# Patient Record
Sex: Male | Born: 1985 | Race: Black or African American | Hispanic: No | Marital: Single | Smoking: Light tobacco smoker
Health system: Southern US, Community
[De-identification: ages and names within clinical notes are randomized; demographics above are authoritative.]

## PROBLEM LIST (undated history)

## (undated) DIAGNOSIS — I499 Cardiac arrhythmia, unspecified: Secondary | ICD-10-CM

## (undated) DIAGNOSIS — J40 Bronchitis, not specified as acute or chronic: Secondary | ICD-10-CM

## (undated) HISTORY — PX: WISDOM TOOTH EXTRACTION: SHX21

---
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
## 2007-02-04 ENCOUNTER — Emergency Department (HOSPITAL_COMMUNITY): Admission: EM | Admit: 2007-02-04 | Discharge: 2007-02-04 | Payer: Self-pay | Admitting: Emergency Medicine

## 2007-08-11 ENCOUNTER — Emergency Department (HOSPITAL_COMMUNITY): Admission: EM | Admit: 2007-08-11 | Discharge: 2007-08-11 | Payer: Self-pay | Admitting: Emergency Medicine

## 2010-08-02 ENCOUNTER — Emergency Department (HOSPITAL_COMMUNITY)
Admission: EM | Admit: 2010-08-02 | Discharge: 2010-08-02 | Payer: Self-pay | Source: Home / Self Care | Attending: Emergency Medicine | Admitting: Emergency Medicine

## 2011-04-04 LAB — URINALYSIS, ROUTINE W REFLEX MICROSCOPIC
Bilirubin Urine: NEGATIVE
Glucose, UA: NEGATIVE
Hgb urine dipstick: NEGATIVE
Ketones, ur: 15 — AB
Nitrite: NEGATIVE
Protein, ur: NEGATIVE
Specific Gravity, Urine: 1.027
Urobilinogen, UA: 1
pH: 6

## 2011-04-04 LAB — GC/CHLAMYDIA PROBE AMP, GENITAL
Chlamydia, DNA Probe: POSITIVE — AB
GC Probe Amp, Genital: NEGATIVE

## 2011-07-17 ENCOUNTER — Emergency Department (INDEPENDENT_AMBULATORY_CARE_PROVIDER_SITE_OTHER)
Admission: EM | Admit: 2011-07-17 | Discharge: 2011-07-17 | Disposition: A | Discharge status: Home / Self Care | Payer: 59 | Source: Home / Self Care

## 2011-07-17 DIAGNOSIS — R591 Generalized enlarged lymph nodes: Secondary | ICD-10-CM

## 2011-07-17 DIAGNOSIS — K089 Disorder of teeth and supporting structures, unspecified: Secondary | ICD-10-CM

## 2011-07-17 DIAGNOSIS — R599 Enlarged lymph nodes, unspecified: Secondary | ICD-10-CM

## 2011-07-17 DIAGNOSIS — K0889 Other specified disorders of teeth and supporting structures: Secondary | ICD-10-CM

## 2011-07-17 MED ORDER — AMOXICILLIN 500 MG PO CAPS: 500.0000 mg | ORAL_CAPSULE | Freq: Three times a day (TID) | ORAL | Status: AC

## 2011-07-17 MED ORDER — IBUPROFEN 800 MG PO TABS: 800.0000 mg | ORAL_TABLET | Freq: Three times a day (TID) | ORAL | Status: AC

## 2011-07-17 NOTE — ED Provider Notes (Signed)
History     CSN: [id number]  Arrival date & time 07/17/11  1230   None     Chief Complaint  Patient presents with  . Lymphadenopathy    (Consider location/radiation/quality/duration/timing/severity/associated sxs/prior treatment) HPI Comments: Pt states he has had some teeth coming in bilat lower posterior gums for a few weeks. Last 2 days he has noticed a tender lump Rt jaw. No swelling of jaw or face. He denies fever or chlls. He has been taking otc pain relievers for his dental pain with mild relief of discomfort.  He has not scheduled an appt with a dentist.   The history is provided by the patient.    History reviewed. No pertinent past medical history.  History reviewed. No pertinent past surgical history.  History reviewed. No pertinent family history.  History  Substance Use Topics  . Smoking status: Current Everyday Smoker  . Smokeless tobacco: Not on file  . Alcohol Use: Yes     socaialy       Review of Systems  Constitutional: Negative for fever and chills.  HENT: Negative for ear pain, congestion, sore throat, rhinorrhea, neck pain and sinus pressure.   Respiratory: Negative for cough.     Allergies  Review of patient's allergies indicates no known allergies.  Home Medications   Current Outpatient Rx  Name Route Sig Dispense Refill  . AMOXICILLIN 500 MG PO CAPS Oral Take 1 capsule (500 mg total) by mouth 3 (three) times daily. 21 capsule 0  . IBUPROFEN 800 MG PO TABS Oral Take 1 tablet (800 mg total) by mouth 3 (three) times daily. 15 tablet 0    BP 146/78  Pulse 61  Temp(Src) 98.2 F (36.8 C) (Oral)  Resp 16  SpO2 98%  Physical Exam  Nursing note and vitals reviewed. Constitutional: He appears well-developed and well-nourished. No distress.  HENT:  Head: Normocephalic and atraumatic.  Right Ear: Tympanic membrane, external ear and ear canal normal.  Left Ear: Tympanic membrane, external ear and ear canal normal.  Nose: Nose normal.    Mouth/Throat: Uvula is midline, oropharynx is clear and moist and mucous membranes are normal. No oropharyngeal exudate, posterior oropharyngeal edema or posterior oropharyngeal erythema.         No facial swelling.   Neck: Neck supple.  Cardiovascular: Normal rate, regular rhythm and normal heart sounds.   Pulmonary/Chest: Effort normal and breath sounds normal. No respiratory distress.  Lymphadenopathy:       Head (right side): Submandibular (one < 1 cm smooth tender ) adenopathy present. No submental and no tonsillar adenopathy present.       Head (left side): No submental, no submandibular and no tonsillar adenopathy present.    He has no cervical adenopathy.  Neurological: He is alert.  Skin: Skin is warm and dry.  Psychiatric: He has a normal mood and affect.    ED Course  Procedures (including critical care time)  Labs Reviewed - No data to display No results found.   1. Pain, dental   2. Lymphadenopathy       MDM  Dental pain with mild Rt submadibular adenopathy.  Pt advised to f/u with dentist.         Melody Comas, PA 07/21/11 1021

## 2011-07-17 NOTE — ED Notes (Signed)
C/o pain in right lower jaw  X past 2 days where he has a tooth coming in, slight redness noted ; NAD

## 2011-07-23 NOTE — ED Provider Notes (Signed)
Medical screening examination/treatment/procedure(s) were performed by non-physician practitioner and as supervising physician I was immediately available for consultation/collaboration.   Lorma Heater DOUGLAS MD.    Dan Bailey Dan Clester Chlebowski, MD 07/23/11 0820 

## 2012-02-23 ENCOUNTER — Emergency Department (HOSPITAL_COMMUNITY)
Admission: EM | Admit: 2012-02-23 | Discharge: 2012-02-23 | Disposition: A | Discharge status: Home / Self Care | Payer: 59 | Attending: Emergency Medicine | Admitting: Emergency Medicine

## 2012-02-23 ENCOUNTER — Encounter (HOSPITAL_COMMUNITY): Payer: Self-pay | Admitting: Emergency Medicine

## 2012-02-23 DIAGNOSIS — X58XXXA Exposure to other specified factors, initial encounter: Secondary | ICD-10-CM | POA: Insufficient documentation

## 2012-02-23 DIAGNOSIS — K0889 Other specified disorders of teeth and supporting structures: Secondary | ICD-10-CM

## 2012-02-23 DIAGNOSIS — S025XXA Fracture of tooth (traumatic), initial encounter for closed fracture: Secondary | ICD-10-CM | POA: Insufficient documentation

## 2012-02-23 MED ORDER — HYDROCODONE-ACETAMINOPHEN 7.5-500 MG/15ML PO SOLN: 15.0000 mL | Freq: Four times a day (QID) | ORAL | Status: AC | PRN

## 2012-02-23 MED ORDER — PENICILLIN V POTASSIUM 250 MG PO TABS: 250.0000 mg | ORAL_TABLET | Freq: Four times a day (QID) | ORAL | Status: AC

## 2012-02-23 NOTE — ED Notes (Signed)
Pt c/o R jaw pain, pt stating he have broken teeth, onset yesterday, has not seen dentist

## 2012-02-25 NOTE — ED Provider Notes (Signed)
History     CSN: [id number]  Arrival date & time 02/23/12  2044   First MD Initiated Contact with Patient 02/23/12 2129      Chief Complaint  Patient presents with  . Dental Pain    (Consider location/radiation/quality/duration/timing/severity/associated sxs/prior treatment) HPI Comments: Pt thinks he may have a broken tooth on the R lower side. Does not have a local dentist. Pain x 2 days.  Patient is a 26 y.o. male presenting with tooth pain. The history is provided by the patient.  Dental PainThe primary symptoms include mouth pain. Primary symptoms do not include dental injury, oral bleeding, oral lesions, headaches, fever, shortness of breath, sore throat, angioedema or cough. The symptoms began yesterday. The symptoms are worsening. The symptoms are new. The symptoms occur constantly.  Affected locations include: teeth.  Additional symptoms include: dental sensitivity to temperature, jaw pain and facial swelling. Additional symptoms do not include: gum swelling, gum tenderness, purulent gums, trismus, trouble swallowing, pain with swallowing, excessive salivation, dry mouth, taste disturbance, smell disturbance and drooling.    History reviewed. No pertinent past medical history.  History reviewed. No pertinent past surgical history.  No family history on file.  History  Substance Use Topics  . Smoking status: Former Games developer  . Smokeless tobacco: Not on file  . Alcohol Use: Yes     socaialy       Review of Systems  Constitutional: Negative for fever.  HENT: Positive for facial swelling. Negative for sore throat, drooling and trouble swallowing.   Respiratory: Negative for cough and shortness of breath.   Neurological: Negative for headaches.    Allergies  Review of patient's allergies indicates no known allergies.  Home Medications   Current Outpatient Rx  Name Route Sig Dispense Refill  . ACETAMINOPHEN 500 MG PO TABS Oral Take 1,000 mg by mouth every 6 (six)  hours as needed. Tooth ache    . HYDROCODONE-ACETAMINOPHEN 7.5-500 MG/15ML PO SOLN Oral Take 15 mLs by mouth every 6 (six) hours as needed for pain. 120 mL 0  . PENICILLIN V POTASSIUM 250 MG PO TABS Oral Take 1 tablet (250 mg total) by mouth 4 (four) times daily. 40 tablet 0    BP 149/85  Pulse 50  Temp 99.2 F (37.3 C) (Oral)  Resp 16  Ht 5\' 11"  (1.803 m)  Wt 175 lb (79.379 kg)  BMI 24.41 kg/m2  SpO2 100%  Physical Exam  Nursing note and vitals reviewed. Constitutional: He appears well-developed and well-nourished. No distress.  HENT:  Head: Normocephalic and atraumatic.       Very slight, subtle swelling to R jawline No trismus Broken 3rd molar on R bottom, tender to same No purulence or swelling of gums  Eyes:       Normal appearance  Neck: Normal range of motion. Neck supple.  Cardiovascular: Normal rate.   Pulmonary/Chest: Effort normal.  Musculoskeletal: Normal range of motion.  Lymphadenopathy:    He has no cervical adenopathy.  Neurological: He is alert.  Skin: Skin is warm and dry. He is not diaphoretic.  Psychiatric: He has a normal mood and affect.    ED Course  Procedures (including critical care time)  Labs Reviewed - No data to display No results found.   1. Pain, dental       MDM  Pt presents with dental pain. Has broken 3rd molar, tender to same. No obvious evidence of dental abscess on exam. No trismus. Pt will be txed with pcn and  lortab elixir. Instructed on importance of f/u with dentist. Return precautions discussed.  Grant Fontana, PA-C 02/25/12 1539

## 2012-02-26 NOTE — ED Provider Notes (Signed)
Medical screening examination/treatment/procedure(s) were performed by non-physician practitioner and as supervising physician I was immediately available for consultation/collaboration. Channon Brougher, MD, FACEP   Nivia Gervase L Keyandre Pileggi, MD 02/26/12 1509 

## 2012-04-03 ENCOUNTER — Encounter (HOSPITAL_COMMUNITY): Payer: Self-pay | Admitting: Emergency Medicine

## 2012-04-03 ENCOUNTER — Emergency Department (INDEPENDENT_AMBULATORY_CARE_PROVIDER_SITE_OTHER)
Admission: EM | Admit: 2012-04-03 | Discharge: 2012-04-03 | Disposition: A | Discharge status: Home / Self Care | Payer: 59 | Source: Home / Self Care

## 2012-04-03 DIAGNOSIS — Z202 Contact with and (suspected) exposure to infections with a predominantly sexual mode of transmission: Secondary | ICD-10-CM

## 2012-04-03 DIAGNOSIS — Z2089 Contact with and (suspected) exposure to other communicable diseases: Secondary | ICD-10-CM

## 2012-04-03 MED ORDER — CEFTRIAXONE SODIUM 250 MG IJ SOLR
INTRAMUSCULAR | Status: AC
  Filled 2012-04-03: qty 250

## 2012-04-03 MED ORDER — AZITHROMYCIN 1 G PO PACK
1.0000 g | PACK | Freq: Once | ORAL | Status: AC
  Administered 2012-04-03: 1 g via ORAL

## 2012-04-03 MED ORDER — AZITHROMYCIN 250 MG PO TABS
ORAL_TABLET | ORAL | Status: AC
  Filled 2012-04-03: qty 1

## 2012-04-03 MED ORDER — LIDOCAINE HCL (PF) 1 % IJ SOLN
INTRAMUSCULAR | Status: AC
  Filled 2012-04-03: qty 5

## 2012-04-03 MED ORDER — AZITHROMYCIN 250 MG PO TABS
ORAL_TABLET | ORAL | Status: AC
  Filled 2012-04-03: qty 4

## 2012-04-03 MED ORDER — CEFTRIAXONE SODIUM 250 MG IJ SOLR
250.0000 mg | Freq: Once | INTRAMUSCULAR | Status: AC
  Administered 2012-04-03: 250 mg via INTRAMUSCULAR

## 2012-04-03 NOTE — ED Notes (Signed)
Pt tolerated well meds... No signs of allergic reaction etc.

## 2012-04-03 NOTE — ED Notes (Signed)
Pt is here to get checked for poss STD exposure.... Pt is asymptomatic... Denies: discharge, urinary problems, fever, nausea, vomiting, diarrhea... States his partner called him and told him that she is being treated for gonorrhea.

## 2012-04-03 NOTE — ED Provider Notes (Addendum)
History     CSN: [id number]  Arrival date & time 04/03/12  1333   None     Chief Complaint  Patient presents with  . Exposure to STD    (Consider location/radiation/quality/duration/timing/severity/associated sxs/prior treatment) HPI Comments: The patient states that he was told by sexual partner that she had gonorrhea and/or Chlamydia recently associated with PID. He denies having any genitourinary symptoms at all and does not feel ill. he has no fever, chills or urinary symptoms.  Patient is a 26 y.o. male presenting with STD exposure.  Exposure to STD    History reviewed. No pertinent past medical history.  Past Surgical History  Procedure Date  . Wisdom tooth extraction     No family history on file.  History  Substance Use Topics  . Smoking status: Current Every Day Smoker -- 0.5 packs/day    Types: Cigarettes  . Smokeless tobacco: Not on file  . Alcohol Use: Yes     socaialy       Review of Systems  Constitutional: Negative.   HENT: Negative.   Respiratory: Negative.   Cardiovascular: Negative.   Genitourinary: Negative.   Neurological: Negative.     Allergies  Review of patient's allergies indicates no known allergies.  Home Medications   Current Outpatient Rx  Name Route Sig Dispense Refill  . ACETAMINOPHEN 500 MG PO TABS Oral Take 1,000 mg by mouth every 6 (six) hours as needed. Tooth ache      BP 148/80  Pulse 54  Temp 98.2 F (36.8 C) (Oral)  Resp 16  SpO2 100%  Physical Exam  Constitutional: He is oriented to person, place, and time. He appears well-developed and well-nourished. No distress.  Neck: Normal range of motion. Neck supple.  Genitourinary: Penis normal. No penile tenderness.       Normal phallus, no discharge. No observed skin lesions, no regional adenopathy  Musculoskeletal: Normal range of motion. He exhibits no edema and no tenderness.  Neurological: He is alert and oriented to person, place, and time.  Skin: Skin is  warm and dry.    ED Course  Procedures (including critical care time)  Labs Reviewed - No data to display No results found.   1. Exposure to STD       MDM  Rocephin 250 mg IM now Azithromycin 1 g by mouth now GC chlamydia DNA obtained, await results.       Hayden Rasmussen, NP 04/03/12 1509  Hayden Rasmussen, NP 08/03/12 1918

## 2012-04-03 NOTE — ED Provider Notes (Signed)
Medical screening examination/treatment/procedure(s) were performed by non-physician practitioner and as supervising physician I was immediately available for consultation/collaboration.  Keimya Briddell   Seward Coran, MD 04/03/12 1519 

## 2012-04-04 LAB — GC/CHLAMYDIA PROBE AMP, GENITAL: Chlamydia, DNA Probe: NEGATIVE

## 2012-04-08 NOTE — ED Notes (Signed)
Dan Bailey, Clinical research associate of this [employer] , spoke with this patient to advise of negative findings on his STD screening report, after confirming ID

## 2012-07-27 ENCOUNTER — Emergency Department (HOSPITAL_COMMUNITY): Payer: 59

## 2012-07-27 ENCOUNTER — Encounter (HOSPITAL_COMMUNITY): Payer: Self-pay | Admitting: *Deleted

## 2012-07-27 ENCOUNTER — Emergency Department (HOSPITAL_COMMUNITY)
Admission: EM | Admit: 2012-07-27 | Discharge: 2012-07-27 | Disposition: A | Discharge status: Home / Self Care | Payer: 59 | Attending: Emergency Medicine | Admitting: Emergency Medicine

## 2012-07-27 DIAGNOSIS — F17200 Nicotine dependence, unspecified, uncomplicated: Secondary | ICD-10-CM | POA: Insufficient documentation

## 2012-07-27 DIAGNOSIS — Y9389 Activity, other specified: Secondary | ICD-10-CM | POA: Insufficient documentation

## 2012-07-27 DIAGNOSIS — S9780XA Crushing injury of unspecified foot, initial encounter: Secondary | ICD-10-CM | POA: Insufficient documentation

## 2012-07-27 DIAGNOSIS — Y9269 Other specified industrial and construction area as the place of occurrence of the external cause: Secondary | ICD-10-CM | POA: Insufficient documentation

## 2012-07-27 DIAGNOSIS — S97122A Crushing injury of left lesser toe(s), initial encounter: Secondary | ICD-10-CM

## 2012-07-27 DIAGNOSIS — W208XXA Other cause of strike by thrown, projected or falling object, initial encounter: Secondary | ICD-10-CM | POA: Insufficient documentation

## 2012-07-27 MED ORDER — IBUPROFEN 600 MG PO TABS: 600.0000 mg | ORAL_TABLET | Freq: Four times a day (QID) | ORAL | Status: DC | PRN

## 2012-07-27 NOTE — ED Notes (Signed)
Unable to obtain a oral temp. Tried 3 different times.

## 2012-07-27 NOTE — ED Provider Notes (Signed)
History     CSN: [id number]  Arrival date & time 07/27/12  4098   First MD Initiated Contact with Patient 07/27/12 0720      Chief Complaint  Patient presents with  . Foot Pain    (Consider location/radiation/quality/duration/timing/severity/associated sxs/prior treatment) HPI Dan Bailey is a 27 y.o. male who presents with complaint of a toe pain. States was at work, and dropped a heavy box onto his toe. Pain to the left 5th toe, swelling, bruising. Pt states he elevated it, iced it. Has not taken anything for pain. States pain with walking and movement of the toe. No other complaints.    History reviewed. No pertinent past medical history.  Past Surgical History  Procedure Date  . Wisdom tooth extraction     History reviewed. No pertinent family history.  History  Substance Use Topics  . Smoking status: Current Every Day Smoker -- 0.5 packs/day    Types: Cigarettes  . Smokeless tobacco: Not on file  . Alcohol Use: Yes     Comment: socaialy       Review of Systems  Constitutional: Negative for fever.  Musculoskeletal: Positive for joint swelling and arthralgias.  Skin: Positive for color change.  Neurological: Negative for weakness and numbness.    Allergies  Review of patient's allergies indicates no known allergies.  Home Medications   Current Outpatient Rx  Name  Route  Sig  Dispense  Refill  . ACETAMINOPHEN 500 MG PO TABS   Oral   Take 1,000 mg by mouth every 6 (six) hours as needed. Tooth ache           BP 124/86  Pulse 53  Resp 16  SpO2 98%  Physical Exam  Nursing note and vitals reviewed. Constitutional: He appears well-developed and well-nourished. No distress.  Cardiovascular: Normal rate, regular rhythm and normal heart sounds.   Pulmonary/Chest: Effort normal and breath sounds normal. No respiratory distress. He has no wheezes. He has no rales.  Musculoskeletal:       5th left toe is swollen, bruised. Tender to palpation from  the MTP joint down entire phalanx. Pain with ROM of the 5th toe at MTP joint. Rest of the foot and toes normal.   Neurological: He is alert.  Skin: Skin is warm and dry.    ED Course  Procedures (including critical care time)  Labs Reviewed - No data to display Dg Foot Complete Left  07/27/2012  *RADIOLOGY REPORT*  Clinical Data: Fifth metatarsal and fifth toe pain with bruising.  LEFT FOOT - COMPLETE 3+ VIEW  Comparison: None.  Findings: No acute osseous or joint abnormality.  IMPRESSION: No acute osseous or joint abnormality.   Original Report Authenticated By: Leanna Battles, M.D.      1. Crushing injury of fifth toe of left foot       MDM  Pt with contusion to the left 5th toe. X-ray negative. Suspect bruising/crush injury. Will treat with elevation, ice, NSAIDs. No further treatment necessary.           Lottie Mussel, [place] 07/27/12 [phone]

## 2012-07-27 NOTE — ED Notes (Signed)
Dropped 60 lb object on left 5th toe. Toe is purple- colored and painful. Worse with ambulation.

## 2012-07-27 NOTE — ED Notes (Addendum)
C/o L foot pain, anterior near forefoot/toes, heavy wood fell on foot, was wearing work boots (not steel toed), occurred Sunday afternoon 1500. Denies sx other than pain. Ambulating with limp.

## 2012-07-28 NOTE — ED Provider Notes (Signed)
Medical screening examination/treatment/procedure(s) were performed by non-physician practitioner and as supervising physician I was immediately available for consultation/collaboration.   Charles B. Sheldon, MD 07/28/12 0710 

## 2012-08-03 NOTE — ED Provider Notes (Signed)
Medical screening examination/treatment/procedure(s) were performed by resident physician or non-physician practitioner and as supervising physician I was immediately available for consultation/collaboration.   Barkley Bruns MD.    Linna Hoff, MD 08/03/12 1924

## 2013-03-12 ENCOUNTER — Emergency Department (HOSPITAL_COMMUNITY)
Admission: EM | Admit: 2013-03-12 | Discharge: 2013-03-12 | Disposition: A | Discharge status: Home / Self Care | Payer: 59 | Attending: Emergency Medicine | Admitting: Emergency Medicine

## 2013-03-12 ENCOUNTER — Encounter (HOSPITAL_COMMUNITY): Payer: Self-pay

## 2013-03-12 ENCOUNTER — Emergency Department (HOSPITAL_COMMUNITY): Payer: 59

## 2013-03-12 DIAGNOSIS — F17200 Nicotine dependence, unspecified, uncomplicated: Secondary | ICD-10-CM | POA: Insufficient documentation

## 2013-03-12 DIAGNOSIS — M549 Dorsalgia, unspecified: Secondary | ICD-10-CM | POA: Insufficient documentation

## 2013-03-12 DIAGNOSIS — R0789 Other chest pain: Secondary | ICD-10-CM | POA: Insufficient documentation

## 2013-03-12 MED ORDER — KETOROLAC TROMETHAMINE 60 MG/2ML IM SOLN
60.0000 mg | Freq: Once | INTRAMUSCULAR | Status: AC
  Administered 2013-03-12: 60 mg via INTRAMUSCULAR
  Filled 2013-03-12: qty 2

## 2013-03-12 MED ORDER — IBUPROFEN 600 MG PO TABS: 600.0000 mg | ORAL_TABLET | Freq: Four times a day (QID) | ORAL | Status: AC | PRN

## 2013-03-12 NOTE — ED Notes (Signed)
Pt taken to room, pt undressed, in gown, on monitor and EKG performed

## 2013-03-12 NOTE — ED Notes (Addendum)
Pt states he started having left back pain yesterday morning which moved around to his left chest. The pain is constant and hurts more when he breathes and with movement. Also reports a dry cough for the past few days. 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 States that he had trouble lifting a 20 lb box today.

## 2013-03-12 NOTE — ED Provider Notes (Signed)
CSN: [id number]     Arrival date & time 03/12/13  4098 History   First MD Initiated Contact with Patient 03/12/13 [phone]     Chief Complaint  Patient presents with  . Chest Pain   (Consider location/radiation/quality/duration/timing/severity/associated sxs/prior Treatment) Patient is a 27 y.o. male presenting with chest pain.  Chest Pain Pain location:  L chest Pain quality: aching   Pain radiates to:  Does not radiate Pain radiates to the back: yes   Pain severity:  Moderate Onset quality:  Gradual Duration:  1 day Timing:  Constant Progression:  Unchanged Chronicity:  New Context: at rest   Relieved by:  Rest Worsened by:  Coughing, deep breathing and movement Ineffective treatments:  None tried Associated symptoms: back pain   Associated symptoms: no abdominal pain, no anxiety, no cough, no diaphoresis, no dizziness, no dysphagia, no fatigue, no fever, no headache, no nausea, no numbness, no shortness of breath, not vomiting and no weakness   Risk factors: male sex and smoking   Risk factors: no coronary artery disease, no diabetes mellitus, no high cholesterol, no hypertension, not obese and no prior DVT/PE     History reviewed. No pertinent past medical history. Past Surgical History  Procedure Laterality Date  . Wisdom tooth extraction     History reviewed. No pertinent family history. History  Substance Use Topics  . Smoking status: Current Every Day Smoker -- 0.25 packs/day    Types: Cigarettes  . Smokeless tobacco: Not on file  . Alcohol Use: Yes     Comment: socaially     Review of Systems  Constitutional: Negative for fever, diaphoresis, activity change, appetite change and fatigue.  HENT: Negative for congestion, facial swelling, rhinorrhea and trouble swallowing.   Eyes: Negative for photophobia and pain.  Respiratory: Negative for cough, chest tightness and shortness of breath.   Cardiovascular: Positive for chest pain. Negative for leg swelling.   Gastrointestinal: Negative for nausea, vomiting, abdominal pain, diarrhea and constipation.  Endocrine: Negative for polydipsia and polyuria.  Genitourinary: Negative for dysuria, urgency, decreased urine volume and difficulty urinating.  Musculoskeletal: Positive for back pain. Negative for gait problem.  Skin: Negative for color change, rash and wound.  Allergic/Immunologic: Negative for immunocompromised state.  Neurological: Negative for dizziness, facial asymmetry, speech difficulty, weakness, numbness and headaches.  Psychiatric/Behavioral: Negative for confusion, decreased concentration and agitation.    Allergies  Review of patient's allergies indicates no known allergies.  Home Medications   Current Outpatient Rx  Name  Route  Sig  Dispense  Refill  . ibuprofen (ADVIL,MOTRIN) 600 MG tablet   Oral   Take 1 tablet (600 mg total) by mouth every 6 (six) hours as needed for pain.   30 tablet   0    BP 137/72  Pulse 65  Temp(Src) 97.9 F (36.6 C) (Oral)  Resp 19  SpO2 98% Physical Exam  Constitutional: He is oriented to person, place, and time. He appears well-developed and well-nourished. No distress.  HENT:  Head: Normocephalic and atraumatic.  Mouth/Throat: No oropharyngeal exudate.  Eyes: Pupils are equal, round, and reactive to light.  Neck: Normal range of motion. Neck supple.  Cardiovascular: Normal rate, regular rhythm and normal heart sounds.  Exam reveals no gallop and no friction rub.   No murmur heard. Pulmonary/Chest: Effort normal and breath sounds normal. No respiratory distress. He has no wheezes. He has no rales.  Abdominal: Soft. Bowel sounds are normal. He exhibits no distension and no mass. There is no  tenderness. There is no rebound and no guarding.  Musculoskeletal: Normal range of motion. He exhibits no edema and no tenderness.  Neurological: He is alert and oriented to person, place, and time.  Skin: Skin is warm and dry.  Psychiatric: He has a  normal mood and affect.    ED Course  Procedures (including critical care time) Labs Review Labs Reviewed - No data to display Imaging Review Dg Chest 2 View  03/12/2013   *RADIOLOGY REPORT*  Clinical Data: Chest pain  CHEST - 2 VIEW  Comparison: August 02, 2010.  Findings: Cardiomediastinal silhouette appears normal.  No acute pulmonary disease is noted.  Bony thorax is intact.  IMPRESSION: No acute cardiopulmonary abnormality seen.   Original Report Authenticated By: Lupita Raider.,  M.D.     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 Atypical chest pain     Pt is a 27 y.o. male with Pmhx as above who presents with L sided CP worse w/ mvmt, deep breathing & cough.  No fever, chills, LE edema.  No known injury.  PERC negative.  No significant EKG changes from prior.  CXR shows no acute abnormalities.  Doubt ACS, PE, pna, pericarditis and feel MSK pain vs costochondritis more likely.  Will d/c home with instructions for regular NSAIDs.  Return precautions given for new or worsening symptoms including worsening pain, SOB, fever, leg swelling  1. Atypical chest pain         Shanna Cisco, MD 03/12/13 2001

## 2013-03-12 NOTE — ED Notes (Signed)
Dr. Docherty at the bedside.  

## 2013-06-19 ENCOUNTER — Encounter (HOSPITAL_COMMUNITY): Payer: Self-pay | Admitting: Emergency Medicine

## 2013-06-19 ENCOUNTER — Emergency Department (HOSPITAL_COMMUNITY)
Admission: EM | Admit: 2013-06-19 | Discharge: 2013-06-19 | Disposition: A | Discharge status: Home / Self Care | Payer: 59 | Source: Home / Self Care | Attending: Emergency Medicine | Admitting: Emergency Medicine

## 2013-06-19 DIAGNOSIS — J209 Acute bronchitis, unspecified: Secondary | ICD-10-CM

## 2013-06-19 MED ORDER — AZITHROMYCIN 250 MG PO TABS: ORAL_TABLET | ORAL | Status: AC

## 2013-06-19 MED ORDER — ALBUTEROL SULFATE HFA 108 (90 BASE) MCG/ACT IN AERS: 2.0000 | INHALATION_SPRAY | Freq: Four times a day (QID) | RESPIRATORY_TRACT | Status: AC | PRN

## 2013-06-19 MED ORDER — HYDROCOD POLST-CHLORPHEN POLST 10-8 MG/5ML PO LQCR: 5.0000 mL | Freq: Two times a day (BID) | ORAL | Status: AC | PRN

## 2013-06-19 MED ORDER — PREDNISONE 20 MG PO TABS: 20.0000 mg | ORAL_TABLET | Freq: Two times a day (BID) | ORAL | Status: AC

## 2013-06-19 NOTE — ED Notes (Signed)
C/O productive cough that started 11/23, that went away x 3-4 days, but then returned; cough now dry.  C/O chest pain with coughing.  Felt feverish yesterday.  Has tried Tussin, Delsym, and Zicam without relief.

## 2013-06-19 NOTE — ED Provider Notes (Signed)
Chief Complaint:   Chief Complaint  Patient presents with  . Cough    History of Present Illness:   Dan Bailey is a 27 year old male who has had a one half week history of dry cough and chest tightness. He has no history of asthma but is a cigarette smoker. He has chest pain when he coughs, sore throat, hoarseness, nasal congestion, had a migraine type headache, had sweats and chills but no fever and feels nauseated but not had any vomiting. He felt feverish yesterday. He denies any exposures but works for [employer]. He's tried Delsym, Tussin, and Zicam without relief.  Review of Systems:  Other than noted above, the patient denies any of the following symptoms: Systemic:  No fevers, chills, sweats, weight loss or gain, fatigue, or tiredness. Eye:  No redness or discharge. ENT:  No ear pain, drainage, headache, nasal congestion, drainage, sinus pressure, difficulty swallowing, or sore throat. Neck:  No neck pain or swollen glands. Lungs:  No cough, sputum production, hemoptysis, wheezing, chest tightness, shortness of breath or chest pain. GI:  No abdominal pain, nausea, vomiting or diarrhea.  PMFSH:  Past medical history, family history, social history, meds, and allergies were reviewed.  Physical Exam:   Vital signs:  BP 145/87  Pulse 52  Temp(Src) 98.3 F (36.8 C) (Oral)  Resp 16  SpO2 100% General:  Alert and oriented.  In no distress.  Skin warm and dry. Eye:  No conjunctival injection or drainage. Lids were normal. ENT:  TMs and canals were normal, without erythema or inflammation.  Nasal mucosa was clear and uncongested, without drainage.  Mucous membranes were moist.  Pharynx was clear with no exudate or drainage.  There were no oral ulcerations or lesions. Neck:  Supple, no adenopathy, tenderness or mass. Lungs:  No respiratory distress.  Lungs were clear to auscultation, without wheezes, rales or rhonchi.  Breath sounds were clear and equal bilaterally.  Heart:  Regular  rhythm, without gallops, murmers or rubs. Skin:  Clear, warm, and dry, without rash or lesions.  Assessment:  The encounter diagnosis was Acute bronchitis.  Plan:   1.  Meds:  The following meds were prescribed:   Discharge Medication List as of 06/19/2013  9:56 AM    START taking these medications   Details  albuterol (PROVENTIL HFA;VENTOLIN HFA) 108 (90 BASE) MCG/ACT inhaler Inhale 2 puffs into the lungs every 6 (six) hours as needed for wheezing or shortness of breath., Starting 06/19/2013, Until Discontinued, Normal    azithromycin (ZITHROMAX Z-PAK) 250 MG tablet Take as directed., Normal    chlorpheniramine-HYDROcodone (TUSSIONEX) 10-8 MG/5ML LQCR Take 5 mLs by mouth every 12 (twelve) hours as needed for cough., Starting 06/19/2013, Until Discontinued, Normal    predniSONE (DELTASONE) 20 MG tablet Take 1 tablet (20 mg total) by mouth 2 (two) times daily., Starting 06/19/2013, Until Discontinued, Normal        2.  Patient Education/Counseling:  The patient was given appropriate handouts, self care instructions, and instructed in symptomatic relief.  She was strongly encouraged to quit smoking.  3.  Follow up:  The patient was told to follow up if no better in 3 to 4 days, if becoming worse in any way, and given some red flag symptoms such as increasing fever or difficulty breathing which would prompt immediate return.  Follow up here as necessary.      Reuben Likes, MD 06/19/13 1048

## 2014-07-11 ENCOUNTER — Emergency Department (HOSPITAL_COMMUNITY)
Admission: EM | Admit: 2014-07-11 | Discharge: 2014-07-11 | Disposition: A | Discharge status: Home / Self Care | Payer: 59 | Attending: Emergency Medicine | Admitting: Emergency Medicine

## 2014-07-11 ENCOUNTER — Encounter (HOSPITAL_COMMUNITY): Payer: Self-pay | Admitting: Family Medicine

## 2014-07-11 DIAGNOSIS — Z79899 Other long term (current) drug therapy: Secondary | ICD-10-CM | POA: Diagnosis not present

## 2014-07-11 DIAGNOSIS — J029 Acute pharyngitis, unspecified: Secondary | ICD-10-CM | POA: Diagnosis present

## 2014-07-11 DIAGNOSIS — R630 Anorexia: Secondary | ICD-10-CM | POA: Insufficient documentation

## 2014-07-11 DIAGNOSIS — J069 Acute upper respiratory infection, unspecified: Secondary | ICD-10-CM | POA: Diagnosis not present

## 2014-07-11 DIAGNOSIS — R609 Edema, unspecified: Secondary | ICD-10-CM | POA: Diagnosis not present

## 2014-07-11 DIAGNOSIS — Z720 Tobacco use: Secondary | ICD-10-CM | POA: Insufficient documentation

## 2014-07-11 DIAGNOSIS — R110 Nausea: Secondary | ICD-10-CM | POA: Diagnosis not present

## 2014-07-11 DIAGNOSIS — R52 Pain, unspecified: Secondary | ICD-10-CM | POA: Diagnosis not present

## 2014-07-11 LAB — RAPID STREP SCREEN (MED CTR MEBANE ONLY): STREPTOCOCCUS, GROUP A SCREEN (DIRECT): NEGATIVE

## 2014-07-11 MED ORDER — AMOXICILLIN 500 MG PO TABS: 1000.0000 mg | ORAL_TABLET | Freq: Two times a day (BID) | ORAL | Status: AC

## 2014-07-11 NOTE — ED Provider Notes (Signed)
CSN: [id number]     Arrival date & time 07/11/14  0907 History  This chart was scribed for non-physician practitioner, Allen DerryMercedes Camprubi-Soms, PA-C working with Warnell Foresterrey Wofford, MD by Greggory StallionKayla Andersen, ED scribe. This patient was seen in room TR07C/TR07C and the patient's care was started at 9:37 AM.    Chief Complaint  Patient presents with  . Sore Throat  . Headache  . Fever   Patient is a 28 y.o. male presenting with pharyngitis. The history is provided by the patient. No language interpreter was used.  Sore Throat This is a new problem. The current episode started 12 to 24 hours ago. The problem occurs constantly. The problem has not changed since onset.Pertinent negatives include no chest pain, no abdominal pain and no shortness of breath. The symptoms are aggravated by swallowing. Nothing relieves the symptoms. He has tried nothing for the symptoms.    HPI Comments: Dan Bailey is a 28 y.o. male who presents to the Emergency Department complaining of constant sore throat that started yesterday. Rates sore throat as 7/10 and describes it as aching. Swallowing worsens the pain. Also reports decreased appetite, generalized body aches, clear rhinorrhea, mild nonproductive cough, and mild nausea. States he had chills yesterday. Pt has not taken any medications yet, no known alleviating factors for his symptoms. Denies ear pain, ear drainage, congestion, watery eyes, itchy eyes, chest pain, SOB, abdominal pain, emesis, diarrhea, constipation, dysuria, hematuria. Denies history of seasonal allergies. Pt has not gotten a flu shot this year. 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  History reviewed. No pertinent past medical history. Past Surgical History  Procedure Laterality Date  . Wisdom tooth extraction     History reviewed. No pertinent family history. History  Substance Use Topics  . Smoking status: Current Every Day Smoker    Types: Cigarettes  . Smokeless tobacco: Not  on file     Comment: Smokes 1 pack per week  . Alcohol Use: Yes     Comment: occasionally    Review of Systems  Constitutional: Positive for chills and appetite change. Negative for fever.  HENT: Positive for rhinorrhea and sore throat. Negative for drooling, ear discharge, ear pain and sinus pressure.   Eyes: Negative for discharge (watery, now resolved ) and itching.  Respiratory: Positive for cough (intermittent nonproductive). Negative for shortness of breath.   Cardiovascular: Negative for chest pain.  Gastrointestinal: Positive for nausea. Negative for vomiting, abdominal pain, diarrhea and constipation.  Genitourinary: Negative for dysuria and hematuria.  Musculoskeletal: Positive for myalgias (generalized body aches).  Skin: Negative for rash.  Allergic/Immunologic: Negative for environmental allergies.    10 systems reviewed and are negative for acute changes except as noted in the HPI.  Allergies  Review of patient's allergies indicates no known allergies.  Home Medications   Prior to Admission medications   Medication Sig Start Date End Date Taking? Authorizing Provider  albuterol (PROVENTIL HFA;VENTOLIN HFA) 108 (90 BASE) MCG/ACT inhaler Inhale 2 puffs into the lungs every 6 (six) hours as needed for wheezing or shortness of breath. 06/19/13   Reuben Likesavid C Keller, MD  azithromycin (ZITHROMAX Z-PAK) 250 MG tablet Take as directed. 06/19/13   Reuben Likesavid C Keller, MD  chlorpheniramine-HYDROcodone (TUSSIONEX) 10-8 MG/5ML LQCR Take 5 mLs by mouth every 12 (twelve) hours as needed for cough. 06/19/13   Reuben Likesavid C Keller, MD  ibuprofen (ADVIL,MOTRIN) 600 MG tablet Take 1 tablet (600 mg total) by mouth every 6 (six) hours as needed for pain.  03/12/13   Toy Cookey, MD  predniSONE (DELTASONE) 20 MG tablet Take 1 tablet (20 mg total) by mouth 2 (two) times daily. 06/19/13   Reuben Likes, MD   BP 121/73 mmHg  Pulse 77  Temp(Src) 97.5 F (36.4 C)  Resp 18  SpO2 98%   Physical Exam   Constitutional: He is oriented to person, place, and time. Vital signs are normal. He appears well-developed and well-nourished. No distress.  Afebrile, nontoxic, NAD  HENT:  Head: Normocephalic and atraumatic.  Right Ear: Hearing, tympanic membrane, external ear and ear canal normal.  Left Ear: Hearing, tympanic membrane, external ear and ear canal normal.  Nose: Mucosal edema (mild) present.  Mouth/Throat: Uvula is midline and mucous membranes are normal. No trismus in the jaw. No uvula swelling. Oropharyngeal exudate, posterior oropharyngeal edema and posterior oropharyngeal erythema present. No tonsillar abscesses.  2+ tonsillar edema bilaterally with exudates and erythema, no abscess, no trismus or drooling, no uvular swelling or deviation. MMM. Ears clear bilaterally. B/l nasal turbinates edematous and mildly erythematous.   Eyes: Conjunctivae and EOM are normal. Right eye exhibits no discharge. Left eye exhibits no discharge.  Neck: Normal range of motion. Neck supple.  Cardiovascular: Normal rate, regular rhythm and normal heart sounds.  Exam reveals no gallop and no friction rub.   No murmur heard. Pulmonary/Chest: Effort normal and breath sounds normal. No respiratory distress. He has no decreased breath sounds. He has no wheezes. He has no rhonchi. He has no rales.  CTAB in all lung fields  Abdominal: Soft. Normal appearance and bowel sounds are normal. He exhibits no distension. There is no tenderness. There is no rigidity, no rebound and no guarding.  Soft NTND, +BS throughout, no r/g/r  Musculoskeletal: Normal range of motion.  Lymphadenopathy:       Head (right side): Tonsillar adenopathy present.       Head (left side): Tonsillar adenopathy present.    He has cervical adenopathy.  Shotty cervical and tonsillar LAD bilaterally.  Neurological: He is alert and oriented to person, place, and time.  Skin: Skin is warm, dry and intact. No rash noted.  Psychiatric: He has a normal  mood and affect. His behavior is normal.  Nursing note and vitals reviewed.   ED Course  Procedures (including critical care time)  DIAGNOSTIC STUDIES: Oxygen Saturation is 98% on RA, normal by my interpretation.    COORDINATION OF CARE: 9:42 AM-Discussed treatment plan which includes rapid strep test with pt at bedside and pt agreed to plan.   Labs Review Labs Reviewed  RAPID STREP SCREEN  CULTURE, GROUP A STREP    Imaging Review No results found.   EKG Interpretation None      MDM   Final diagnoses:  Pharyngitis  URI (upper respiratory infection)    28 y.o. male with URI symptoms and sore throat. CENTOR criteria moderate. 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 Pt opted for amox script instead of bicillin shot. Discussed symptom control at home. Will have him f/up with PCP in 1wk. I explained the diagnosis and have given explicit precautions to return to the ER including for any other new or worsening symptoms. The patient understands and accepts the medical plan as it's been dictated and I have answered their questions. Discharge instructions concerning home care and prescriptions have been given. The patient is STABLE and is discharged to home in good condition.  I personally performed the services described in this documentation, which was scribed in my presence. The recorded information has been reviewed and is accurate.  BP 121/73 mmHg  Pulse 77  Temp(Src) 97.5 F (36.4 C)  Resp 18  SpO2 98%  Meds ordered this encounter  Medications  . amoxicillin (AMOXIL) 500 MG tablet    Sig: Take 2 tablets (1,000 mg total) by mouth 2 (two) times daily.    Dispense:  28 tablet    Refill:  0    Order Specific Question:  Supervising Provider    Answer:  Vida RollerMILLER, BRIAN D [id number]]     Zaylynn Rickett Strupp Douglas Cityamprubi-Soms, PA-C 07/11/14 1008  Merrie RoofJohn David Wofford III, MD 07/12/14 [phone]

## 2014-07-11 NOTE — ED Notes (Signed)
Pt sts sore throat, fever, and not eating x 24 hours.

## 2014-07-11 NOTE — Discharge Instructions (Signed)
Continue to stay well-hydrated. Gargle warm salt water and spit it out. Continue to alternate between Tylenol and Ibuprofen for pain or fever. Use Mucinex for cough suppression/expectoration of mucus. Use netipot and flonase to help with nasal congestion. May consider over-the-counter Benadryl or other antihistamine to decrease secretions and for watery itchy eyes. Take antibiotic as directed and finish all of it. Followup with your primary care doctor in 5-7 days for recheck of ongoing symptoms. Return to emergency department for emergent changing or worsening of symptoms.   Pharyngitis Pharyngitis is a sore throat (pharynx). There is redness, pain, and swelling of your throat. HOME CARE   Drink enough fluids to keep your pee (urine) clear or pale yellow.  Only take medicine as told by your doctor.  You may get sick again if you do not take medicine as told. Finish your medicines, even if you start to feel better.  Do not take aspirin.  Rest.  Rinse your mouth (gargle) with salt water ( tsp of salt per 1 qt of water) every 1-2 hours. This will help the pain.  If you are not at risk for choking, you can suck on hard candy or sore throat lozenges. GET HELP IF:  You have large, tender lumps on your neck.  You have a rash.  You cough up green, yellow-brown, or bloody spit. GET HELP RIGHT AWAY IF:   You have a stiff neck.  You drool or cannot swallow liquids.  You throw up (vomit) or are not able to keep medicine or liquids down.  You have very bad pain that does not go away with medicine.  You have problems breathing (not from a stuffy nose). MAKE SURE YOU:   Understand these instructions.  Will watch your condition.  Will get help right away if you are not doing well or get worse. Document Released: 12/18/2007 Document Revised: 04/21/2013 Document Reviewed: 03/08/2013 [facility] Patient Information 2015 [place], [place]. This information is not intended to replace advice  given to you by your health care provider. Make sure you discuss any questions you have with your health care provider.  Salt Water Gargle This solution will help make your mouth and throat feel better. HOME CARE INSTRUCTIONS   Mix 1 teaspoon of salt in 8 ounces of warm water.  Gargle with this solution as much or often as you need or as directed. Swish and gargle gently if you have any sores or wounds in your mouth.  Do not swallow this mixture. Document Released: 04/04/2004 Document Revised: 09/23/2011 Document Reviewed: 08/26/2008 [facility] Patient Information 2015 [place], [place]. This information is not intended to replace advice given to you by your health care provider. Make sure you discuss any questions you have with your health care provider.

## 2014-07-13 LAB — CULTURE, GROUP A STREP

## 2014-11-15 ENCOUNTER — Emergency Department (HOSPITAL_BASED_OUTPATIENT_CLINIC_OR_DEPARTMENT_OTHER)
Admission: EM | Admit: 2014-11-15 | Discharge: 2014-11-15 | Disposition: A | Discharge status: Home / Self Care | Payer: 59 | Attending: Emergency Medicine | Admitting: Emergency Medicine

## 2014-11-15 ENCOUNTER — Encounter (HOSPITAL_BASED_OUTPATIENT_CLINIC_OR_DEPARTMENT_OTHER): Payer: Self-pay | Admitting: Emergency Medicine

## 2014-11-15 DIAGNOSIS — Z792 Long term (current) use of antibiotics: Secondary | ICD-10-CM | POA: Insufficient documentation

## 2014-11-15 DIAGNOSIS — Z720 Tobacco use: Secondary | ICD-10-CM | POA: Insufficient documentation

## 2014-11-15 DIAGNOSIS — B349 Viral infection, unspecified: Secondary | ICD-10-CM | POA: Insufficient documentation

## 2014-11-15 DIAGNOSIS — R197 Diarrhea, unspecified: Secondary | ICD-10-CM | POA: Diagnosis present

## 2014-11-15 DIAGNOSIS — Z7952 Long term (current) use of systemic steroids: Secondary | ICD-10-CM | POA: Diagnosis not present

## 2014-11-15 DIAGNOSIS — Z79899 Other long term (current) drug therapy: Secondary | ICD-10-CM | POA: Diagnosis not present

## 2014-11-15 DIAGNOSIS — Z8679 Personal history of other diseases of the circulatory system: Secondary | ICD-10-CM | POA: Diagnosis not present

## 2014-11-15 DIAGNOSIS — Z8709 Personal history of other diseases of the respiratory system: Secondary | ICD-10-CM | POA: Diagnosis not present

## 2014-11-15 HISTORY — DX: Cardiac arrhythmia, unspecified: I49.9

## 2014-11-15 HISTORY — DX: Bronchitis, not specified as acute or chronic: J40

## 2014-11-15 LAB — COMPREHENSIVE METABOLIC PANEL
ALK PHOS: 48 U/L (ref 38–126)
ALT: 27 U/L (ref 17–63)
AST: 37 U/L (ref 15–41)
Albumin: 4.5 g/dL (ref 3.5–5.0)
Anion gap: 8 (ref 5–15)
BILIRUBIN TOTAL: 0.4 mg/dL (ref 0.3–1.2)
BUN: 11 mg/dL (ref 6–20)
CALCIUM: 9.3 mg/dL (ref 8.9–10.3)
CO2: 25 mmol/L (ref 22–32)
Chloride: 106 mmol/L (ref 101–111)
Creatinine, Ser: 1.4 mg/dL — ABNORMAL HIGH (ref 0.61–1.24)
GFR calc non Af Amer: >60 mL/min (ref >60)
GLUCOSE: 91 mg/dL (ref 70–99)
POTASSIUM: 3.9 mmol/L (ref 3.5–5.1)
Sodium: 139 mmol/L (ref 135–145)
TOTAL PROTEIN: 7.5 g/dL (ref 6.5–8.1)

## 2014-11-15 LAB — CBC
HEMATOCRIT: 45.4 % (ref 39.0–52.0)
Hemoglobin: 15.9 g/dL (ref 13.0–17.0)
MCH: 30.9 pg (ref 26.0–34.0)
MCHC: 35 g/dL (ref 30.0–36.0)
MCV: 88.2 fL (ref 78.0–100.0)
PLATELETS: 164 10*3/uL (ref 150–400)
RBC: 5.15 MIL/uL (ref 4.22–5.81)
RDW: 13.2 % (ref 11.5–15.5)
WBC: 10.7 10*3/uL — ABNORMAL HIGH (ref 4.0–10.5)

## 2014-11-15 LAB — LIPASE, BLOOD: LIPASE: 32 U/L (ref 22–51)

## 2014-11-15 MED ORDER — SODIUM CHLORIDE 0.9 % IV BOLUS (SEPSIS)
1000.0000 mL | Freq: Once | INTRAVENOUS | Status: AC
  Administered 2014-11-15: 1000 mL via INTRAVENOUS

## 2014-11-15 MED ORDER — ONDANSETRON HCL 4 MG/2ML IJ SOLN
4.0000 mg | Freq: Once | INTRAMUSCULAR | Status: AC
  Administered 2014-11-15: 4 mg via INTRAVENOUS
  Filled 2014-11-15: qty 2

## 2014-11-15 MED ORDER — ACETAMINOPHEN 325 MG PO TABS
650.0000 mg | ORAL_TABLET | Freq: Once | ORAL | Status: AC
  Administered 2014-11-15: 650 mg via ORAL
  Filled 2014-11-15: qty 2

## 2014-11-15 NOTE — ED Notes (Signed)
29 yo c/o headache, diarrhea and fever since yesterday. Reports taking benadryl yesterday. Denies N/V. Afebrile at triage.

## 2014-11-15 NOTE — ED Provider Notes (Signed)
CSN: [id number]     Arrival date & time 11/15/14  1224 History   First MD Initiated Contact with Patient 11/15/14 1308     Chief Complaint  Patient presents with  . Headache  . Diarrhea     (Consider location/radiation/quality/duration/timing/severity/associated sxs/prior Treatment) HPI Comments: 29 year old male complaining of gradual onset generalized headache beginning yesterday around 3 PM, followed by fatigue, subjective fever and stomachache. This morning at 3 AM he started to have diarrhea, has had 4 episodes of nonbloody, normal stool colored diarrhea. Has a decreased appetite. Was able to eat a banana today. Admits to nausea without vomiting. States he is feeling weak and dizzy. No recent travel or change in diet. No sick contacts. Did not have flu vaccine this year. He tried taking Benadryl yesterday with no relief.  Patient is a 29 y.o. male presenting with headaches and diarrhea. The history is provided by the patient.  Headache Associated symptoms: diarrhea, dizziness, nausea and weakness   Diarrhea Associated symptoms: headaches     Past Medical History  Diagnosis Date  . Bronchitis   . Irregular heart beat    Past Surgical History  Procedure Laterality Date  . Wisdom tooth extraction     History reviewed. No pertinent family history. History  Substance Use Topics  . Smoking status: Light Tobacco Smoker -- 0.10 packs/day    Types: Cigarettes  . Smokeless tobacco: Not on file     Comment: Smokes 1 pack per week  . Alcohol Use: Yes     Comment: occasionally    Review of Systems  Gastrointestinal: Positive for nausea and diarrhea.  Neurological: Positive for dizziness, weakness and headaches.  All other systems reviewed and are negative.     Allergies  Review of patient's allergies indicates no known allergies.  Home Medications   Prior to Admission medications   Medication Sig Start Date End Date Taking? Authorizing Provider  albuterol (PROVENTIL  HFA;VENTOLIN HFA) 108 (90 BASE) MCG/ACT inhaler Inhale 2 puffs into the lungs every 6 (six) hours as needed for wheezing or shortness of breath. 06/19/13   Reuben Likes, MD  amoxicillin (AMOXIL) 500 MG tablet Take 2 tablets (1,000 mg total) by mouth 2 (two) times daily. 07/11/14   Mercedes Camprubi-Soms, PA-C  azithromycin (ZITHROMAX Z-PAK) 250 MG tablet Take as directed. 06/19/13   Reuben Likes, MD  chlorpheniramine-HYDROcodone (TUSSIONEX) 10-8 MG/5ML LQCR Take 5 mLs by mouth every 12 (twelve) hours as needed for cough. 06/19/13   Reuben Likes, MD  ibuprofen (ADVIL,MOTRIN) 600 MG tablet Take 1 tablet (600 mg total) by mouth every 6 (six) hours as needed for pain. 03/12/13   Toy Cookey, MD  predniSONE (DELTASONE) 20 MG tablet Take 1 tablet (20 mg total) by mouth 2 (two) times daily. 06/19/13   Reuben Likes, MD   BP 152/86 mmHg  Pulse 58  Temp(Src) 99.2 F (37.3 C) (Oral)  Resp 16  Ht  (1.753 m)  Wt 190 lb (86.183 kg)  BMI 28.05 kg/m2  SpO2 100% Physical Exam  Constitutional: He is oriented to person, place, and time. He appears well-developed and well-nourished. No distress.  HENT:  Head: Normocephalic and atraumatic.  Mouth/Throat: Oropharynx is clear and moist.  Eyes: Conjunctivae and EOM are normal. Pupils are equal, round, and reactive to light.  Neck: Normal range of motion. Neck supple. No spinous process tenderness and no muscular tenderness present.  No meningeal signs.  Cardiovascular: Normal rate, regular rhythm, normal heart sounds and intact  distal pulses.   Pulmonary/Chest: Effort normal and breath sounds normal. No respiratory distress.  Abdominal: Soft. Bowel sounds are normal. He exhibits no distension.  Mild tenderness just above umbilicus. No rigidity, guarding or rebound. No peritoneal signs.  Musculoskeletal: Normal range of motion. He exhibits no edema.  Neurological: He is alert and oriented to person, place, and time. He has normal strength. No cranial  nerve deficit or sensory deficit. He displays a negative Romberg sign. Coordination and gait normal. GCS eye subscore is 4. GCS verbal subscore is 5. GCS motor subscore is 6.  Speech fluent and goal oriented. Moves limbs without ataxia. Equal grip strength bilaterally.  Skin: Skin is warm and dry. No rash noted. He is not diaphoretic.  Psychiatric: He has a normal mood and affect. His behavior is normal.  Nursing note and vitals reviewed.   ED Course  Procedures (including critical care time) Labs Review Labs Reviewed  CBC - Abnormal; Notable for the following:    WBC 10.7 (*)    All other components within normal limits  COMPREHENSIVE METABOLIC PANEL - Abnormal; Notable for the following:    Creatinine, Ser 1.40 (*)    All other components within normal limits  LIPASE, BLOOD    Imaging Review No results found.   EKG Interpretation None      MDM   Final diagnoses:  Viral illness  Diarrhea   Nontoxic appearing, NAD. Afebrile. Vital signs stable. Abdomen is soft with no peritoneal signs. Labs with mild leukocytosis of 10.7. Creatinine mildly elevated at 1.4. Advised to discuss with PCP, possibly from mild dehydration. No other acute findings. After receiving a liter of fluids and Zofran, patient reports he feels much better. Abdomen no longer tender. His symptoms are flulike. Discussed symptomatic treatment. Stable for discharge. Return precautions given. Patient states understanding of treatment care plan and is agreeable.   Kathrynn SpeedRobyn M Gemma Ruan, PA-C 11/15/14 1456  Kathrynn Speedobyn M Aanchal Cope, PA-C 11/15/14 1501  Kathrynn Speedobyn M Janisse Ghan, PA-C 11/15/14 1503  Blake DivineJohn Wofford, MD 11/15/14 [phone]

## 2014-11-15 NOTE — Discharge Instructions (Signed)
Viral Infections A viral infection can be caused by different types of viruses.Most viral infections are not serious and resolve on their own. However, some infections may cause severe symptoms and may lead to further complications. SYMPTOMS Viruses can frequently cause:  Minor sore throat.  Aches and pains.  Headaches.  Runny nose.  Different types of rashes.  Watery eyes.  Tiredness.  Cough.  Loss of appetite.  Gastrointestinal infections, resulting in nausea, vomiting, and diarrhea. These symptoms do not respond to antibiotics because the infection is not caused by bacteria. However, you might catch a bacterial infection following the viral infection. This is sometimes called a "superinfection." Symptoms of such a bacterial infection may include:  Worsening sore throat with pus and difficulty swallowing.  Swollen neck glands.  Chills and a high or persistent fever.  Severe headache.  Tenderness over the sinuses.  Persistent overall ill feeling (malaise), muscle aches, and tiredness (fatigue).  Persistent cough.  Yellow, green, or brown mucus production with coughing. HOME CARE INSTRUCTIONS   Only take over-the-counter or prescription medicines for pain, discomfort, diarrhea, or fever as directed by your caregiver.  Drink enough water and fluids to keep your urine clear or pale yellow. Sports drinks can provide valuable electrolytes, sugars, and hydration.  Get plenty of rest and maintain proper nutrition. Soups and broths with crackers or rice are fine. SEEK IMMEDIATE MEDICAL CARE IF:   You have severe headaches, shortness of breath, chest pain, neck pain, or an unusual rash.  You have uncontrolled vomiting, diarrhea, or you are unable to keep down fluids.  You or your child has an oral temperature above 102 F (38.9 C), not controlled by medicine.  Your baby is older than 3 months with a rectal temperature of 102 F (38.9 C) or higher.  Your baby is 403  months old or younger with a rectal temperature of 100.4 F (38 C) or higher. MAKE SURE YOU:   Understand these instructions.  Will watch your condition.  Will get help right away if you are not doing well or get worse. Document Released: 04/10/2005 Document Revised: 09/23/2011 Document Reviewed: 11/05/2010 [facility] Patient Information 2015 [place], [place]. This information is not intended to replace advice given to you by your health care provider. Make sure you discuss any questions you have with your health care provider. Food Choices to Help Relieve Diarrhea When you have diarrhea, the foods you eat and your eating habits are very important. Choosing the right foods and drinks can help relieve diarrhea. Also, because diarrhea can last up to 7 days, you need to replace lost fluids and electrolytes (such as sodium, potassium, and chloride) in order to help prevent dehydration.  WHAT GENERAL GUIDELINES DO I NEED TO FOLLOW?  Slowly drink 1 cup (8 oz) of fluid for each episode of diarrhea. If you are getting enough fluid, your urine will be clear or pale yellow.  Eat starchy foods. Some good choices include white rice, white toast, pasta, low-fiber cereal, baked potatoes (without the skin), saltine crackers, and bagels.  Avoid large servings of any cooked vegetables.  Limit fruit to two servings per day. A serving is  cup or 1 small piece.  Choose foods with less than 2 g of fiber per serving.  Limit fats to less than 8 tsp (38 g) per day.  Avoid fried foods.  Eat foods that have probiotics in them. Probiotics can be found in certain dairy products.  Avoid foods and beverages that may increase the speed  at which food moves through the stomach and intestines (gastrointestinal tract). Things to avoid include:  High-fiber foods, such as dried fruit, raw fruits and vegetables, nuts, seeds, and whole grain foods.  Spicy foods and high-fat foods.  Foods and beverages sweetened with  high-fructose corn syrup, honey, or sugar alcohols such as xylitol, sorbitol, and mannitol. WHAT FOODS ARE RECOMMENDED? Grains White rice. White, [place], or pita breads (fresh or toasted), including plain rolls, buns, or bagels. White pasta. Saltine, soda, or graham crackers. Pretzels. Low-fiber cereal. Cooked cereals made with water (such as cornmeal, farina, or cream cereals). Plain muffins. Matzo. Melba toast. Zwieback.  Vegetables Potatoes (without the skin). Strained tomato and vegetable juices. Most well-cooked and canned vegetables without seeds. Tender lettuce. Fruits Cooked or canned applesauce, apricots, cherries, fruit cocktail, grapefruit, peaches, pears, or plums. Fresh bananas, apples without skin, cherries, grapes, cantaloupe, grapefruit, peaches, oranges, or plums.  Meat and Other Protein Products Baked or boiled chicken. Eggs. Tofu. Fish. Seafood. Smooth peanut butter. Ground or well-cooked tender beef, ham, veal, lamb, pork, or poultry.  Dairy Plain yogurt, kefir, and unsweetened liquid yogurt. Lactose-free milk, buttermilk, or soy milk. Plain hard cheese. Beverages Sport drinks. Clear broths. Diluted fruit juices (except prune). Regular, caffeine-free sodas such as ginger ale. Water. Decaffeinated teas. Oral rehydration solutions. Sugar-free beverages not sweetened with sugar alcohols. Other Bouillon, broth, or soups made from recommended foods.  The items listed above may not be a complete list of recommended foods or beverages. Contact your dietitian for more options. WHAT FOODS ARE NOT RECOMMENDED? Grains Whole grain, whole wheat, bran, or rye breads, rolls, pastas, crackers, and cereals. Wild or brown rice. Cereals that contain more than 2 g of fiber per serving. Corn tortillas or taco shells. Cooked or dry oatmeal. Granola. Popcorn. Vegetables Raw vegetables. Cabbage, broccoli, Brussels sprouts, artichokes, baked beans, beet greens, corn, kale, legumes, peas, sweet  potatoes, and yams. Potato skins. Cooked spinach and cabbage. Fruits Dried fruit, including raisins and dates. Raw fruits. Stewed or dried prunes. Fresh apples with skin, apricots, mangoes, pears, raspberries, and strawberries.  Meat and Other Protein Products Chunky peanut butter. Nuts and seeds. Beans and lentils. Tomasa Blase.  Dairy High-fat cheeses. Milk, chocolate milk, and beverages made with milk, such as milk shakes. Cream. Ice cream. Sweets and Desserts Sweet rolls, doughnuts, and sweet breads. Pancakes and waffles. Fats and Oils Butter. Cream sauces. Margarine. Salad oils. Plain salad dressings. Olives. Avocados.  Beverages Caffeinated beverages (such as coffee, tea, soda, or energy drinks). Alcoholic beverages. Fruit juices with pulp. Prune juice. Soft drinks sweetened with high-fructose corn syrup or sugar alcohols. Other Coconut. Hot sauce. Chili powder. Mayonnaise. Gravy. Cream-based or milk-based soups.  The items listed above may not be a complete list of foods and beverages to avoid. Contact your dietitian for more information. WHAT SHOULD I DO IF I BECOME DEHYDRATED? Diarrhea can sometimes lead to dehydration. Signs of dehydration include dark urine and dry mouth and skin. If you think you are dehydrated, you should rehydrate with an oral rehydration solution. These solutions can be purchased at pharmacies, retail stores, or online.  Drink -1 cup (120-240 mL) of oral rehydration solution each time you have an episode of diarrhea. If drinking this amount makes your diarrhea worse, try drinking smaller amounts more often. For example, drink 1-3 tsp (5-15 mL) every 5-10 minutes.  A general rule for staying hydrated is to drink 1-2 L of fluid per day. Talk to your health care provider about the specific amount  you should be drinking each day. Drink enough fluids to keep your urine clear or pale yellow. Document Released: 09/21/2003 Document Revised: 07/06/2013 Document Reviewed:  05/24/2013 [facility] Patient Information 2015 [place], [place]. This information is not intended to replace advice given to you by your health care provider. Make sure you discuss any questions you have with your health care provider.

## 2014-11-15 NOTE — ED Notes (Signed)
pa at bedside. 

## 2014-11-16 ENCOUNTER — Emergency Department (HOSPITAL_COMMUNITY)
Admission: EM | Admit: 2014-11-16 | Discharge: 2014-11-16 | Disposition: A | Discharge status: Home / Self Care | Payer: 59 | Attending: Emergency Medicine | Admitting: Emergency Medicine

## 2014-11-16 ENCOUNTER — Encounter (HOSPITAL_COMMUNITY): Payer: Self-pay | Admitting: Emergency Medicine

## 2014-11-16 ENCOUNTER — Emergency Department (HOSPITAL_COMMUNITY): Payer: 59

## 2014-11-16 DIAGNOSIS — Z720 Tobacco use: Secondary | ICD-10-CM | POA: Diagnosis not present

## 2014-11-16 DIAGNOSIS — Z792 Long term (current) use of antibiotics: Secondary | ICD-10-CM | POA: Insufficient documentation

## 2014-11-16 DIAGNOSIS — G43809 Other migraine, not intractable, without status migrainosus: Secondary | ICD-10-CM | POA: Diagnosis not present

## 2014-11-16 DIAGNOSIS — R197 Diarrhea, unspecified: Secondary | ICD-10-CM | POA: Insufficient documentation

## 2014-11-16 DIAGNOSIS — Z8679 Personal history of other diseases of the circulatory system: Secondary | ICD-10-CM | POA: Diagnosis not present

## 2014-11-16 DIAGNOSIS — R51 Headache: Secondary | ICD-10-CM | POA: Diagnosis present

## 2014-11-16 DIAGNOSIS — R42 Dizziness and giddiness: Secondary | ICD-10-CM

## 2014-11-16 DIAGNOSIS — Z8709 Personal history of other diseases of the respiratory system: Secondary | ICD-10-CM | POA: Diagnosis not present

## 2014-11-16 DIAGNOSIS — Z7952 Long term (current) use of systemic steroids: Secondary | ICD-10-CM | POA: Insufficient documentation

## 2014-11-16 LAB — CBC
HEMATOCRIT: 46.6 % (ref 39.0–52.0)
Hemoglobin: 16.1 g/dL (ref 13.0–17.0)
MCH: 30.8 pg (ref 26.0–34.0)
MCHC: 34.5 g/dL (ref 30.0–36.0)
MCV: 89.1 fL (ref 78.0–100.0)
Platelets: 200 10*3/uL (ref 150–400)
RBC: 5.23 MIL/uL (ref 4.22–5.81)
RDW: 13.3 % (ref 11.5–15.5)
WBC: 9.9 10*3/uL (ref 4.0–10.5)

## 2014-11-16 LAB — URINALYSIS, ROUTINE W REFLEX MICROSCOPIC
Bilirubin Urine: NEGATIVE
GLUCOSE, UA: NEGATIVE mg/dL
Hgb urine dipstick: NEGATIVE
Ketones, ur: 15 mg/dL — AB
LEUKOCYTES UA: NEGATIVE
NITRITE: NEGATIVE
PROTEIN: NEGATIVE mg/dL
Specific Gravity, Urine: 1.024 (ref 1.005–1.030)
Urobilinogen, UA: 1 mg/dL (ref 0.0–1.0)
pH: 8 (ref 5.0–8.0)

## 2014-11-16 LAB — BASIC METABOLIC PANEL
Anion gap: 9 (ref 5–15)
BUN: 8 mg/dL (ref 6–20)
CHLORIDE: 105 mmol/L (ref 101–111)
CO2: 27 mmol/L (ref 22–32)
Calcium: 9.5 mg/dL (ref 8.9–10.3)
Creatinine, Ser: 1.42 mg/dL — ABNORMAL HIGH (ref 0.61–1.24)
GLUCOSE: 98 mg/dL (ref 70–99)
POTASSIUM: 3.8 mmol/L (ref 3.5–5.1)
SODIUM: 141 mmol/L (ref 135–145)

## 2014-11-16 LAB — RAPID URINE DRUG SCREEN, HOSP PERFORMED
AMPHETAMINES: NOT DETECTED
Barbiturates: NOT DETECTED
Benzodiazepines: POSITIVE — AB
Cocaine: NOT DETECTED
Opiates: NOT DETECTED
TETRAHYDROCANNABINOL: POSITIVE — AB

## 2014-11-16 MED ORDER — DEXAMETHASONE SODIUM PHOSPHATE 10 MG/ML IJ SOLN
10.0000 mg | Freq: Once | INTRAMUSCULAR | Status: AC
  Administered 2014-11-16: 10 mg via INTRAVENOUS
  Filled 2014-11-16: qty 1

## 2014-11-16 MED ORDER — DIPHENHYDRAMINE HCL 50 MG/ML IJ SOLN
12.5000 mg | Freq: Once | INTRAMUSCULAR | Status: AC
  Administered 2014-11-16: 12.5 mg via INTRAVENOUS
  Filled 2014-11-16: qty 1

## 2014-11-16 MED ORDER — MECLIZINE HCL 25 MG PO TABS
25.0000 mg | ORAL_TABLET | Freq: Once | ORAL | Status: AC
  Administered 2014-11-16: 25 mg via ORAL
  Filled 2014-11-16: qty 1

## 2014-11-16 MED ORDER — DIAZEPAM 5 MG/ML IJ SOLN
2.5000 mg | Freq: Once | INTRAMUSCULAR | Status: AC
  Administered 2014-11-16: 2.5 mg via INTRAVENOUS
  Filled 2014-11-16: qty 2

## 2014-11-16 MED ORDER — METOCLOPRAMIDE HCL 5 MG/ML IJ SOLN
10.0000 mg | Freq: Once | INTRAMUSCULAR | Status: AC
  Administered 2014-11-16: 10 mg via INTRAVENOUS
  Filled 2014-11-16: qty 2

## 2014-11-16 MED ORDER — MECLIZINE HCL 50 MG PO TABS: 50.0000 mg | ORAL_TABLET | Freq: Three times a day (TID) | ORAL | Status: AC | PRN

## 2014-11-16 MED ORDER — KETOROLAC TROMETHAMINE 30 MG/ML IJ SOLN
30.0000 mg | Freq: Once | INTRAMUSCULAR | Status: AC
  Administered 2014-11-16: 30 mg via INTRAVENOUS
  Filled 2014-11-16: qty 1

## 2014-11-16 MED ORDER — DIAZEPAM 5 MG PO TABS: 5.0000 mg | ORAL_TABLET | Freq: Four times a day (QID) | ORAL | Status: AC | PRN

## 2014-11-16 MED ORDER — MECLIZINE HCL 25 MG PO TABS: 50.0000 mg | ORAL_TABLET | Freq: Once | ORAL | Status: DC

## 2014-11-16 MED ORDER — SODIUM CHLORIDE 0.9 % IV BOLUS (SEPSIS)
1000.0000 mL | Freq: Once | INTRAVENOUS | Status: AC
Start: 2014-11-16 — End: 2014-11-16
  Administered 2014-11-16: 1000 mL via INTRAVENOUS

## 2014-11-16 NOTE — ED Notes (Signed)
Pt ambulating in hallway with tech

## 2014-11-16 NOTE — ED Notes (Signed)
Patient transported to CT 

## 2014-11-16 NOTE — ED Notes (Signed)
Walked PT around [employer], no issues found, no dizziness.

## 2014-11-16 NOTE — ED Provider Notes (Signed)
CSN: [id number]     Arrival date & time 11/16/14  1412 History   First MD Initiated Contact with Patient 11/16/14 1603     Chief Complaint  Patient presents with  . Migraine     (Consider location/radiation/quality/duration/timing/severity/associated sxs/prior Treatment) HPI Dan Bailey is a 29 y.o. male with history of headaches in the past, presents to emergency department complaining of a headache for 4 days associated with nausea, vomiting, vertigo-like symptoms. [facility], had lab work done which showed elevated white blood cell count and creatinine, he was hydrated with fluids, Zofran given for nausea, Tylenol for headache. He states he went home, with worsening symptoms. He states he has severe dizziness, states feels like room is spinning around him. He denies any recent illnesses or ear complaints. No ringing in the ears. He states pain is in the front radiating to the back of the head. Denies any neck pain or stiffness. No fever or chills. Denies any head injuries. No abdominal pain.   Past Medical History  Diagnosis Date  . Bronchitis   . Irregular heart beat    Past Surgical History  Procedure Laterality Date  . Wisdom tooth extraction     History reviewed. No pertinent family history. History  Substance Use Topics  . Smoking status: Light Tobacco Smoker -- 0.10 packs/day    Types: Cigarettes  . Smokeless tobacco: Not on file     Comment: Smokes 1 pack per week  . Alcohol Use: Yes     Comment: occasionally    Review of Systems  Constitutional: Negative for fever and chills.  Respiratory: Negative for cough, chest tightness and shortness of breath.   Cardiovascular: Negative for chest pain, palpitations and leg swelling.  Gastrointestinal: Positive for nausea, vomiting and diarrhea. Negative for abdominal pain and abdominal distention.  Genitourinary: Negative for dysuria, urgency, frequency and hematuria.  Musculoskeletal: Negative  for myalgias, arthralgias, neck pain and neck stiffness.  Skin: Negative for rash.  Allergic/Immunologic: Negative for immunocompromised state.  Neurological: Positive for dizziness, light-headedness and headaches. Negative for weakness and numbness.  All other systems reviewed and are negative.     Allergies  Review of patient's allergies indicates no known allergies.  Home Medications   Prior to Admission medications   Medication Sig Start Date End Date Taking? Authorizing Provider  albuterol (PROVENTIL HFA;VENTOLIN HFA) 108 (90 BASE) MCG/ACT inhaler Inhale 2 puffs into the lungs every 6 (six) hours as needed for wheezing or shortness of breath. 06/19/13   Reuben Likesavid C Keller, MD  amoxicillin (AMOXIL) 500 MG tablet Take 2 tablets (1,000 mg total) by mouth 2 (two) times daily. 07/11/14   Mercedes Camprubi-Soms, PA-C  azithromycin (ZITHROMAX Z-PAK) 250 MG tablet Take as directed. 06/19/13   Reuben Likesavid C Keller, MD  chlorpheniramine-HYDROcodone (TUSSIONEX) 10-8 MG/5ML LQCR Take 5 mLs by mouth every 12 (twelve) hours as needed for cough. 06/19/13   Reuben Likesavid C Keller, MD  ibuprofen (ADVIL,MOTRIN) 600 MG tablet Take 1 tablet (600 mg total) by mouth every 6 (six) hours as needed for pain. 03/12/13   Toy CookeyMegan Docherty, MD  predniSONE (DELTASONE) 20 MG tablet Take 1 tablet (20 mg total) by mouth 2 (two) times daily. 06/19/13   Reuben Likesavid C Keller, MD   BP 133/80 mmHg  Pulse 90  Temp(Src) 97.5 F (36.4 C) (Oral)  Resp 18  SpO2 98% Physical Exam  Constitutional: He is oriented to person, place, and time. He appears well-developed and well-nourished. No distress.  HENT:  Head: Normocephalic and atraumatic.  Right Ear: External ear normal.  Left Ear: External ear normal.  Nose: Nose normal.  Mouth/Throat: Oropharynx is clear and moist.  Eyes: Conjunctivae and EOM are normal. Pupils are equal, round, and reactive to light.  Horizontal nystagmus  Neck: Normal range of motion. Neck supple.  Cardiovascular: Normal  rate, regular rhythm and normal heart sounds.   Pulmonary/Chest: Effort normal. No respiratory distress. He has no wheezes. He has no rales.  Abdominal: Soft. Bowel sounds are normal. He exhibits no distension. There is no tenderness. There is no rebound.  Musculoskeletal: He exhibits no edema.  Neurological: He is alert and oriented to person, place, and time. No cranial nerve deficit. Coordination normal.  Skin: Skin is warm and dry.  Nursing note and vitals reviewed.   ED Course  Procedures (including critical care time) Labs Review Labs Reviewed  BASIC METABOLIC PANEL - Abnormal; Notable for the following:    Creatinine, Ser 1.42 (*)    All other components within normal limits  URINALYSIS, ROUTINE W REFLEX MICROSCOPIC - Abnormal; Notable for the following:    APPearance CLOUDY (*)    Ketones, ur 15 (*)    All other components within normal limits  URINE RAPID DRUG SCREEN (HOSP PERFORMED) - Abnormal; Notable for the following:    Benzodiazepines POSITIVE (*)    Tetrahydrocannabinol POSITIVE (*)    All other components within normal limits  CBC    Imaging Review Ct Head Wo Contrast  11/16/2014   CLINICAL DATA:  Migraine headaches  EXAM: CT HEAD WITHOUT CONTRAST  TECHNIQUE: Contiguous axial images were obtained from the base of the skull through the vertex without intravenous contrast.  COMPARISON:  None.  FINDINGS: The bony calvarium is intact. The ventricles are of normal size and configuration. No findings to suggest acute hemorrhage, acute infarction or space-occupying mass lesion are noted.  IMPRESSION: No acute intracranial abnormality noted.   Electronically Signed   By: Alcide CleverMark  Lukens M.D.   On: 11/16/2014 17:37     EKG Interpretation None      MDM   Final diagnoses:  Other migraine without status migrainosus, not intractable  Vertigo    Pt with headache, states he has history of migraines, but has not had a minimal multiple years. Also having vertigo-like symptoms  with significant nystagmus on examination. Will try Toradol, Reglan, Benadryl, Valium. Fluids and labs started.  Discussed with Dr. Agapito GamesZamit, asked for CT head.   7:34 PM Patient feeling much better after Valium, Toradol, Reglan, Benadryl. He was able to ambulate without any difficulties. His nystagmus and vertigo resolved. He still had a mild headache. Will add Decadron. He continues to receive IV fluids.  8:34 PM Patient felt much better, headache improved. He ambulated again to the bathroom with no difficulties. Plan to discharge home with meclizine and Valium as needed. Follow with the ENT regarding his vertigo and headache while the center for headaches. Patient agrees with the plan, I return precautions discussed.  Filed Vitals:   11/16/14 1422 11/16/14 1645 11/16/14 1750 11/16/14 2024  BP: 133/80 131/65 128/62 149/72  Pulse: 90 62 60 62  Temp: 97.5 F (36.4 C)   98.2 F (36.8 C)  TempSrc: Oral   Oral  Resp: 18  16 14   SpO2: 98% 99% 99% 98%     Jaynie Crumbleatyana Glenden Rossell, PA-C 11/16/14 2036  Bethann BerkshireJoseph Zammit, MD 11/17/14 1328

## 2014-11-16 NOTE — ED Notes (Addendum)
Pt has migraine; [facility]. C/o dizziness. Room is spinning. Had fever yesterday. Afebrile currently. Mild nausea. Headache not better with aspirin or tylenol.

## 2014-11-16 NOTE — Discharge Instructions (Signed)
Take meclizine 1-2 tables 3 times a day for the next 2-3 days. Take valium for severe dizziness as needed. Take excedrin migraine for headaches. Follow up with headache wellness center and ENT. Return if worsening.    Benign Positional Vertigo Vertigo means you feel like you or your surroundings are moving when they are not. Benign positional vertigo is the most common form of vertigo. Benign means that the cause of your condition is not serious. Benign positional vertigo is more common in older adults. CAUSES  Benign positional vertigo is the result of an upset in the labyrinth system. This is an area in the middle ear that helps control your balance. This may be caused by a viral infection, head injury, or repetitive motion. However, often no specific cause is found. SYMPTOMS  Symptoms of benign positional vertigo occur when you move your head or eyes in different directions. Some of the symptoms may include:  Loss of balance and falls.  Vomiting.  Blurred vision.  Dizziness.  Nausea.  Involuntary eye movements (nystagmus). DIAGNOSIS  Benign positional vertigo is usually diagnosed by physical exam. If the specific cause of your benign positional vertigo is unknown, your caregiver may perform imaging tests, such as magnetic resonance imaging (MRI) or computed tomography (CT). TREATMENT  Your caregiver may recommend movements or procedures to correct the benign positional vertigo. Medicines such as meclizine, benzodiazepines, and medicines for nausea may be used to treat your symptoms. In rare cases, if your symptoms are caused by certain conditions that affect the inner ear, you may need surgery. HOME CARE INSTRUCTIONS   Follow your caregiver's instructions.  Move slowly. Do not make sudden body or head movements.  Avoid driving.  Avoid operating heavy machinery.  Avoid performing any tasks that would be dangerous to you or others during a vertigo episode.  Drink enough fluids  to keep your urine clear or pale yellow. SEEK IMMEDIATE MEDICAL CARE IF:   You develop problems with walking, weakness, numbness, or using your arms, hands, or legs.  You have difficulty speaking.  You develop severe headaches.  Your nausea or vomiting continues or gets worse.  You develop visual changes.  Your family or friends notice any behavioral changes.  Your condition gets worse.  You have a fever.  You develop a stiff neck or sensitivity to light. MAKE SURE YOU: Migraine Headache A migraine headache is an intense, throbbing pain on one or both sides of your head. A migraine can last for 30 minutes to several hours. CAUSES  The exact cause of a migraine headache is not always known. However, a migraine may be caused when nerves in the brain become irritated and release chemicals that cause inflammation. This causes pain. Certain things may also trigger migraines, such as:  Alcohol.  Smoking.  Stress.  Menstruation.  Aged cheeses.  Foods or drinks that contain nitrates, glutamate, aspartame, or tyramine.  Lack of sleep.  Chocolate.  Caffeine.  Hunger.  Physical exertion.  Fatigue.  Medicines used to treat chest pain (nitroglycerine), birth control pills, estrogen, and some blood pressure medicines. SIGNS AND SYMPTOMS  Pain on one or both sides of your head.  Pulsating or throbbing pain.  Severe pain that prevents daily activities.  Pain that is aggravated by any physical activity.  Nausea, vomiting, or both.  Dizziness.  Pain with exposure to bright lights, loud noises, or activity.  General sensitivity to bright lights, loud noises, or smells. Before you get a migraine, you may get warning signs  that a migraine is coming (aura). An aura may include:  Seeing flashing lights.  Seeing bright spots, halos, or zigzag lines.  Having tunnel vision or blurred vision.  Having feelings of numbness or tingling.  Having trouble  talking.  Having muscle weakness. DIAGNOSIS  A migraine headache is often diagnosed based on:  Symptoms.  Physical exam.  A CT scan or MRI of your head. These imaging tests cannot diagnose migraines, but they can help rule out other causes of headaches. TREATMENT Medicines may be given for pain and nausea. Medicines can also be given to help prevent recurrent migraines.  HOME CARE INSTRUCTIONS  Only take over-the-counter or prescription medicines for pain or discomfort as directed by your health care provider. The use of long-term narcotics is not recommended.  Lie down in a dark, quiet room when you have a migraine.  Keep a journal to find out what may trigger your migraine headaches. For example, write down:  What you eat and drink.  How much sleep you get.  Any change to your diet or medicines.  Limit alcohol consumption.  Quit smoking if you smoke.  Get 7-9 hours of sleep, or as recommended by your health care provider.  Limit stress.  Keep lights dim if bright lights bother you and make your migraines worse. SEEK IMMEDIATE MEDICAL CARE IF:   Your migraine becomes severe.  You have a fever.  You have a stiff neck.  You have vision loss.  You have muscular weakness or loss of muscle control.  You start losing your balance or have trouble walking.  You feel faint or pass out.  You have severe symptoms that are different from your first symptoms. MAKE SURE YOU:   Understand these instructions.  Will watch your condition.  Will get help right away if you are not doing well or get worse. Document Released: 07/01/2005 Document Revised: 11/15/2013 Document Reviewed: 03/08/2013 [facility] Patient Information 2015 [place], [place]. This information is not intended to replace advice given to you by your health care provider. Make sure you discuss any questions you have with your health care provider.   Understand these instructions.  Will watch your  condition.  Will get help right away if you are not doing well or get worse. Document Released: 04/08/2006 Document Revised: 09/23/2011 Document Reviewed: 03/21/2011 [facility] Patient Information 2015 [place], [place]. This information is not intended to replace advice given to you by your health care provider. Make sure you discuss any questions you have with your health care provider.

## 2014-11-17 LAB — I-STAT CHEM 8, ED
BUN: 11 mg/dL (ref 6–20)
CALCIUM ION: 1.13 mmol/L (ref 1.12–1.23)
CHLORIDE: 103 mmol/L (ref 101–111)
Creatinine, Ser: 1.4 mg/dL — ABNORMAL HIGH (ref 0.61–1.24)
GLUCOSE: 87 mg/dL (ref 70–99)
Potassium: 4.2 mmol/L (ref 3.5–5.1)
Sodium: 140 mmol/L (ref 135–145)
TCO2: 23 mmol/L (ref 0–100)

## 2015-06-16 ENCOUNTER — Encounter (HOSPITAL_COMMUNITY): Payer: Self-pay | Admitting: Emergency Medicine

## 2015-06-16 ENCOUNTER — Emergency Department (HOSPITAL_COMMUNITY)
Admission: EM | Admit: 2015-06-16 | Discharge: 2015-06-16 | Disposition: A | Discharge status: Home / Self Care | Payer: 59 | Attending: Emergency Medicine | Admitting: Emergency Medicine

## 2015-06-16 ENCOUNTER — Emergency Department (HOSPITAL_COMMUNITY): Payer: 59

## 2015-06-16 DIAGNOSIS — S60142A Contusion of left ring finger with damage to nail, initial encounter: Secondary | ICD-10-CM | POA: Diagnosis not present

## 2015-06-16 DIAGNOSIS — W231XXA Caught, crushed, jammed, or pinched between stationary objects, initial encounter: Secondary | ICD-10-CM | POA: Insufficient documentation

## 2015-06-16 DIAGNOSIS — Z792 Long term (current) use of antibiotics: Secondary | ICD-10-CM | POA: Insufficient documentation

## 2015-06-16 DIAGNOSIS — Z8709 Personal history of other diseases of the respiratory system: Secondary | ICD-10-CM | POA: Diagnosis not present

## 2015-06-16 DIAGNOSIS — Z8679 Personal history of other diseases of the circulatory system: Secondary | ICD-10-CM | POA: Diagnosis not present

## 2015-06-16 DIAGNOSIS — Y9389 Activity, other specified: Secondary | ICD-10-CM | POA: Diagnosis not present

## 2015-06-16 DIAGNOSIS — Z23 Encounter for immunization: Secondary | ICD-10-CM | POA: Insufficient documentation

## 2015-06-16 DIAGNOSIS — F17210 Nicotine dependence, cigarettes, uncomplicated: Secondary | ICD-10-CM | POA: Insufficient documentation

## 2015-06-16 DIAGNOSIS — Y998 Other external cause status: Secondary | ICD-10-CM | POA: Diagnosis not present

## 2015-06-16 DIAGNOSIS — Z79899 Other long term (current) drug therapy: Secondary | ICD-10-CM | POA: Diagnosis not present

## 2015-06-16 DIAGNOSIS — Y92009 Unspecified place in unspecified non-institutional (private) residence as the place of occurrence of the external cause: Secondary | ICD-10-CM | POA: Diagnosis not present

## 2015-06-16 DIAGNOSIS — S6992XA Unspecified injury of left wrist, hand and finger(s), initial encounter: Secondary | ICD-10-CM | POA: Diagnosis present

## 2015-06-16 MED ORDER — NAPROXEN 375 MG PO TABS: 375.0000 mg | ORAL_TABLET | Freq: Two times a day (BID) | ORAL | Status: AC

## 2015-06-16 MED ORDER — TETANUS-DIPHTH-ACELL PERTUSSIS 5-2.5-18.5 LF-MCG/0.5 IM SUSP
0.5000 mL | Freq: Once | INTRAMUSCULAR | Status: AC
  Administered 2015-06-16: 0.5 mL via INTRAMUSCULAR
  Filled 2015-06-16: qty 0.5

## 2015-06-16 NOTE — ED Notes (Signed)
See PA note.

## 2015-06-16 NOTE — ED Provider Notes (Signed)
CSN: [id number]     Arrival date & time 06/16/15  1111 History  By signing my name below, I, Freida Busmaniana Omoyeni, attest that this documentation has been prepared under the direction and in the presence of non-physician practitioner, Arthor CaptainAbigail Trashaun Streight, PA-C. Electronically Signed: Freida Busmaniana Omoyeni, Scribe. 06/16/2015. 11:30 AM.      Chief Complaint  Patient presents with  . Finger Injury    The history is provided by the patient. No language interpreter was used.     HPI Comments:  Dan Bailey is a 29 y.o. male who presents to the Emergency Department complaining of 6/10 throbbing left ring finger pain following injury this AM. Pt states he smashed his finger in his house door ~ 1 hour PTA. He reports associated bleeding from site. Pt is unsure of tetanus status. No alleviating factors noted.   Past Medical History  Diagnosis Date  . Bronchitis   . Irregular heart beat    Past Surgical History  Procedure Laterality Date  . Wisdom tooth extraction     No family history on file. Social History  Substance Use Topics  . Smoking status: Light Tobacco Smoker -- 0.10 packs/day    Types: Cigarettes  . Smokeless tobacco: None     Comment: Smokes 1 pack per week  . Alcohol Use: Yes     Comment: occasionally    Review of Systems  Constitutional: Negative for fever and chills.  Respiratory: Negative for shortness of breath.   Cardiovascular: Negative for chest pain.  Musculoskeletal: Positive for arthralgias (finger).  Skin: Positive for wound.    Allergies  Review of patient's allergies indicates no known allergies.  Home Medications   Prior to Admission medications   Medication Sig Start Date End Date Taking? Authorizing Provider  citalopram (CELEXA) 20 MG tablet Take 20 mg by mouth daily.   Yes Historical Provider, MD  albuterol (PROVENTIL HFA;VENTOLIN HFA) 108 (90 BASE) MCG/ACT inhaler Inhale 2 puffs into the lungs every 6 (six) hours as needed for wheezing or shortness of  breath. 06/19/13   Reuben Likesavid C Keller, MD  amoxicillin (AMOXIL) 500 MG tablet Take 2 tablets (1,000 mg total) by mouth 2 (two) times daily. 07/11/14   Mercedes Camprubi-Soms, PA-C  azithromycin (ZITHROMAX Z-PAK) 250 MG tablet Take as directed. 06/19/13   Reuben Likesavid C Keller, MD  chlorpheniramine-HYDROcodone (TUSSIONEX) 10-8 MG/5ML LQCR Take 5 mLs by mouth every 12 (twelve) hours as needed for cough. 06/19/13   Reuben Likesavid C Keller, MD  diazepam (VALIUM) 5 MG tablet Take 1 tablet (5 mg total) by mouth every 6 (six) hours as needed (for vertigo). 11/16/14   Tatyana Kirichenko, PA-C  diphenhydrAMINE (BENADRYL) 25 MG tablet Take 25 mg by mouth every 6 (six) hours as needed for itching or allergies.    Historical Provider, MD  ibuprofen (ADVIL,MOTRIN) 600 MG tablet Take 1 tablet (600 mg total) by mouth every 6 (six) hours as needed for pain. 03/12/13   Toy CookeyMegan Docherty, MD  meclizine (ANTIVERT) 50 MG tablet Take 1 tablet (50 mg total) by mouth 3 (three) times daily as needed. 11/16/14   Tatyana Kirichenko, PA-C  naproxen sodium (ANAPROX) 220 MG tablet Take 220 mg by mouth 2 (two) times daily as needed (for headaches).    Historical Provider, MD  predniSONE (DELTASONE) 20 MG tablet Take 1 tablet (20 mg total) by mouth 2 (two) times daily. 06/19/13   Reuben Likesavid C Keller, MD   BP 147/84 mmHg  Pulse 56  Temp(Src) 98.2 F (36.8 C) (Oral)  Resp 18  Ht 6' (1.829 m)  Wt 196 lb 11.2 oz (89.223 kg)  BMI 26.67 kg/m2  SpO2 98% Physical Exam  Constitutional: He is oriented to person, place, and time. He appears well-developed and well-nourished. No distress.  HENT:  Head: Normocephalic and atraumatic.  Eyes: Conjunctivae are normal.  Cardiovascular: Normal rate.   Pulmonary/Chest: Effort normal.  Abdominal: He exhibits no distension.  Neurological: He is alert and oriented to person, place, and time.  Skin: Skin is warm and dry.  Left ring finger brusing under nail bed, draining form lateral portion of nail; 1 cm stellate cut at the  tip of the finger. NVI   Psychiatric: He has a normal mood and affect.  Nursing note and vitals reviewed.   ED Course  .Splint Application Date/Time: 06/16/2015 12:30 PM Performed by: Arthor Captain Authorized by: Arthor Captain Consent: Verbal consent obtained. Risks and benefits: risks, benefits and alternatives were discussed Consent given by: patient Patient identity confirmed: verbally with patient Time out: Immediately prior to procedure a "time out" was called to verify the correct patient, procedure, equipment, support staff and site/side marked as required. Location details: left ring finger Splint type: static finger Supplies used: aluminum splint Post-procedure: The splinted body part was neurovascularly unchanged following the procedure.     DIAGNOSTIC STUDIES:  Oxygen Saturation is 98% on RA, normal by my interpretation.    COORDINATION OF CARE:  11:24 AM Discussed treatment plan with pt at bedside and pt agreed to plan.  Imaging Review Dg Finger Ring Left  06/16/2015  CLINICAL DATA:  Left fourth finger injury after being crushed in metal door. Initial encounter. EXAM: LEFT RING FINGER 2+V COMPARISON:  None. FINDINGS: There is no evidence of fracture or dislocation. There is no evidence of arthropathy or other focal bone abnormality. Soft tissues are unremarkable. IMPRESSION: Normal left fourth finger. Electronically Signed   By: Lupita Raider, M.D.   On: 06/16/2015 12:21   I have personally reviewed and evaluated these images as part of my medical decision-making.    MDM   Final diagnoses:  Injury of tip of finger of left hand, initial encounter    Patient X-Ray negative for obvious fracture or dislocation.  Pt advised to follow up with orthopedics. Patient given splint while in ED, conservative therapy recommended and discussed. Patient will be discharged home & is agreeable with above plan. Returns precautions discussed. Pt appears safe for  discharge.   I personally performed the services described in this documentation, which was scribed in my presence. The recorded information has been reviewed and is accurate.       Arthor Captain, PA-C 06/16/15 1231  Pricilla Loveless, MD 06/19/15 [phone]

## 2015-06-16 NOTE — ED Notes (Signed)
Pt from home for eval of injury to left ring finger, pt states he shut it in a car door. No sensory loss, full rom. Pulses present. Hematoma noted to nail bed. nad noted.

## 2015-06-16 NOTE — Discharge Instructions (Signed)
Crush Injury, Fingers or Toes °A crush injury to the fingers or toes means the tissues have been damaged by being squeezed (compressed). There will be bleeding into the tissues and swelling. Often, blood will collect under the skin. When this happens, the skin on the finger often dies and may slough off (shed) 1 week to 10 days later. Usually, new skin is growing underneath. If the injury has been too severe and the tissue does not survive, the damaged tissue may begin to turn black over several days.  °Wounds which occur because of the crushing may be stitched (sutured) shut. However, crush injuries are more likely to become infected than other injuries. These wounds may not be closed as tightly as other types of cuts to prevent infection. Nails involved are often lost. These usually grow back over several weeks.  °DIAGNOSIS °X-rays may be taken to see if there is any injury to the bones. °TREATMENT °Broken bones (fractures) may be treated with splinting, depending on the fracture. Often, no treatment is required for fractures of the last bone in the fingers or toes. °HOME CARE INSTRUCTIONS  °· The crushed part should be raised (elevated) above the heart or center of the chest as much as possible for the first several days or as directed. This helps with pain and lessens swelling. Less swelling increases the chances that the crushed part will survive. °· Put ice on the injured area. °¨ Put ice in a plastic bag. °¨ Place a towel between your skin and the bag. °¨ Leave the ice on for 15-20 minutes, 03-04 times a day for the first 2 days. °· Only take over-the-counter or prescription medicines for pain, discomfort, or fever as directed by your caregiver. °· Use your injured part only as directed. °· Change your bandages (dressings) as directed. °· Keep all follow-up appointments as directed by your caregiver. Not keeping your appointment could result in a chronic or permanent injury, pain, and disability. If there is  any problem keeping the appointment, you must call to reschedule. °SEEK IMMEDIATE MEDICAL CARE IF:  °· There is redness, swelling, or increasing pain in the wound area. °· Pus is coming from the wound. °· You have a fever. °· You notice a bad smell coming from the wound or dressing. °· The edges of the wound do not stay together after the sutures have been removed. °· You are unable to move the injured finger or toe. °MAKE SURE YOU:  °· Understand these instructions. °· Will watch your condition. °· Will get help right away if you are not doing well or get worse. °  °This information is not intended to replace advice given to you by your health care provider. Make sure you discuss any questions you have with your health care provider. °  °Document Released: 07/01/2005 Document Revised: 09/23/2011 Document Reviewed: 11/16/2010 °[employer]vier Interactive Patient Education ©2016 [employer] ° °

## 2016-04-27 IMAGING — DX DG FINGER RING 2+V*L*
3 series · 3 of 3 positions shown · non-contrast
Comparison: None.

CLINICAL DATA: Left fourth finger injury after being crushed in
metal door. Initial encounter.

EXAM:
LEFT RING FINGER 2+V

[finger ap]
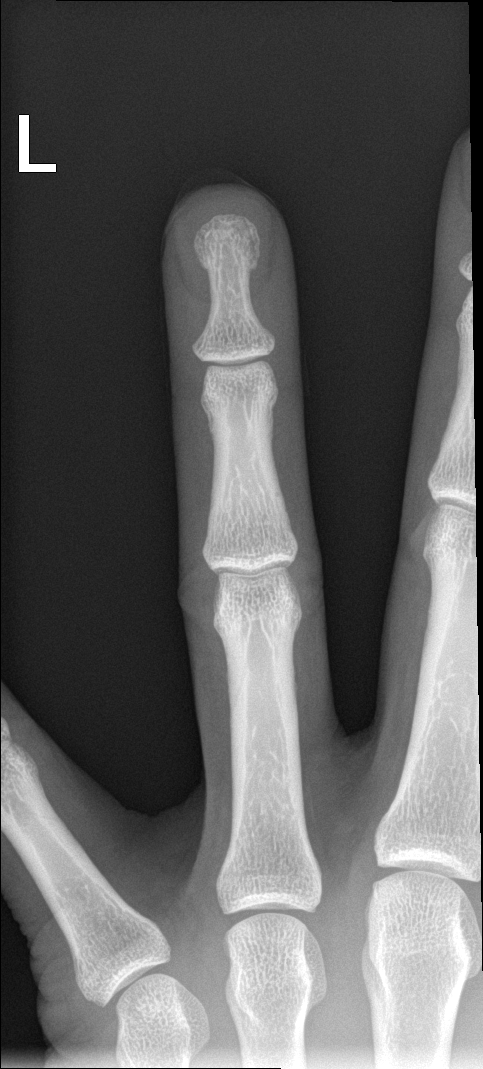

[finger obl]
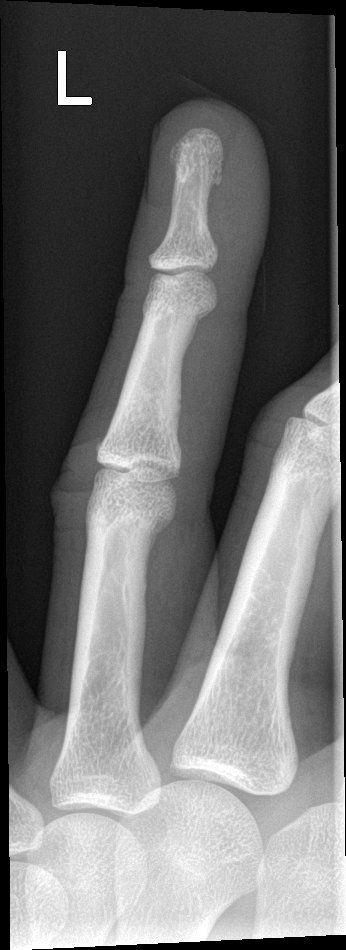

[finger lat]
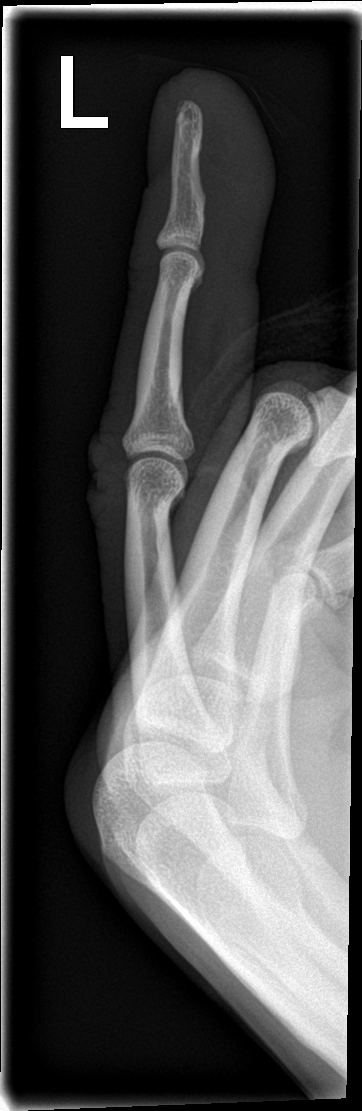

[3 of 3 positions shown; findings below may reference images not displayed]

FINDINGS: There is no evidence of fracture or dislocation. There is no
evidence of arthropathy or other focal bone abnormality. Soft
tissues are unremarkable.
IMPRESSION: Normal left fourth finger.

## 2020-03-07 ENCOUNTER — Other Ambulatory Visit: Payer: 59

## 2020-07-24 ENCOUNTER — Other Ambulatory Visit: Payer: 59

## 2020-07-24 DIAGNOSIS — Z20822 Contact with and (suspected) exposure to covid-19: Secondary | ICD-10-CM

## 2020-07-25 LAB — SARS-COV-2, NAA 2 DAY TAT

## 2020-07-25 LAB — NOVEL CORONAVIRUS, NAA: SARS-CoV-2, NAA: DETECTED — AB
